# Patient Record
Sex: Female | Born: 2000 | Race: White | Hispanic: No | Smoking: Former smoker
Health system: Southern US, Community
[De-identification: ages and names within clinical notes are randomized; demographics above are authoritative.]

## PROBLEM LIST (undated history)

## (undated) DIAGNOSIS — Z8759 Personal history of other complications of pregnancy, childbirth and the puerperium: Secondary | ICD-10-CM

## (undated) DIAGNOSIS — F909 Attention-deficit hyperactivity disorder, unspecified type: Secondary | ICD-10-CM

## (undated) DIAGNOSIS — F319 Bipolar disorder, unspecified: Secondary | ICD-10-CM

## (undated) DIAGNOSIS — T8859XA Other complications of anesthesia, initial encounter: Secondary | ICD-10-CM

## (undated) DIAGNOSIS — F32A Depression, unspecified: Secondary | ICD-10-CM

## (undated) DIAGNOSIS — Z7289 Other problems related to lifestyle: Secondary | ICD-10-CM

## (undated) DIAGNOSIS — F419 Anxiety disorder, unspecified: Secondary | ICD-10-CM

## (undated) DIAGNOSIS — F329 Major depressive disorder, single episode, unspecified: Secondary | ICD-10-CM

## (undated) DIAGNOSIS — I471 Supraventricular tachycardia, unspecified: Secondary | ICD-10-CM

## (undated) HISTORY — DX: Other complications of anesthesia, initial encounter: T88.59XA

## (undated) HISTORY — DX: Attention-deficit hyperactivity disorder, unspecified type: F90.9

## (undated) HISTORY — DX: Depression, unspecified: F32.A

## (undated) HISTORY — PX: NO PAST SURGERIES: SHX2092

## (undated) HISTORY — DX: Anxiety disorder, unspecified: F41.9

## (undated) HISTORY — DX: Bipolar disorder, unspecified: F31.9

## (undated) HISTORY — DX: Personal history of other complications of pregnancy, childbirth and the puerperium: Z87.59

## (undated) HISTORY — DX: Major depressive disorder, single episode, unspecified: F32.9

---
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
## 2022-04-14 ENCOUNTER — Ambulatory Visit
Admission: EM | Admit: 2022-04-14 | Discharge: 2022-04-14 | Disposition: A | Discharge status: Home / Self Care | Payer: Medicaid Other

## 2022-04-14 DIAGNOSIS — J039 Acute tonsillitis, unspecified: Secondary | ICD-10-CM

## 2022-04-14 LAB — POCT RAPID STREP A (OFFICE): Rapid Strep A Screen: NEGATIVE

## 2022-04-14 MED ORDER — FLUCONAZOLE 150 MG PO TABS: 150.0000 mg | ORAL_TABLET | ORAL | 0 refills | Status: DC

## 2022-04-14 MED ORDER — AMOXICILLIN 875 MG PO TABS: 875.0000 mg | ORAL_TABLET | Freq: Two times a day (BID) | ORAL | 0 refills | Status: DC

## 2022-04-14 NOTE — ED Triage Notes (Signed)
Here for "congestion, sore throat" that started yesterday. "Lymph nodes feel swollen". Also of "green mucous, discharge down throat, chills, no fever. No body aches". Have not had flu vaccine this year. No "runny" nose.

## 2022-04-14 NOTE — ED Provider Notes (Signed)
[facility]    CSN: [id number] Arrival date & time: 04/14/22  1229      History   Chief Complaint Chief Complaint  Patient presents with   Nasal Congestion    HPI Amy Barrera is a 22 y.o. female.   Patient presenting today with 1 day history of sore swollen feeling throat, swollen lymph nodes in neck, thick green discharge in throat, chills.  Denies fever, chest pain, shortness of breath, abdominal pain, nausea vomiting or diarrhea.  So far trying over-the-counter cold and congestion medications with minimal relief.  History of recurrent strep infections.    Past Medical History:  Diagnosis Date   ADHD    Anxiety    Bipolar disorder ([place])    Depression    Irritability and anger 11/30/2015   Self-mutilation     Patient Active Problem List   Diagnosis Date Noted   Erroneous encounter - disregard 12/06/2021   Palpitations 11/16/2021   Dizziness 11/16/2021   Chest tightness 11/16/2021   Postpartum hemorrhage 07/27/2021   Indication for care in labor or delivery 07/26/2021   Alpha thalassemia silent carrier 03/31/2021   Bony abnormality 10/06/2014   Primary familial hypertrophic cardiomyopathy ([place]) 10/05/2013    Past Surgical History:  Procedure Laterality Date   NO PAST SURGERIES      OB History     Gravida  1   Para  1   Term  1   Preterm  0   AB  0   Living  1      SAB  0   IAB  0   Ectopic  0   Multiple  0   Live Births  1            Home Medications    Prior to Admission medications   Medication Sig Start Date End Date Taking? Authorizing Provider  acetaminophen (TYLENOL) 325 MG tablet Take by mouth. 04/21/20  Yes [provider]  amoxicillin (AMOXIL) 875 MG tablet Take 1 tablet (875 mg total) by mouth 2 (two) times daily. 04/14/22  Yes Volney American, PA-C  amoxicillin-clavulanate (AUGMENTIN) 875-125 MG tablet Take 1 tablet by mouth every 12 (twelve) hours. 12/13/21  Yes Volney American,  PA-C  busPIRone (BUSPAR) 10 MG tablet Take by mouth. 01/25/20  Yes [provider]  cariprazine (VRAYLAR) 3 MG capsule Take by mouth. 09/17/19  Yes [provider]  divalproex (DEPAKOTE SPRINKLE) 125 MG capsule Take by mouth. 01/25/20  Yes [provider]  fluconazole (DIFLUCAN) 150 MG tablet Take 1 tablet (150 mg total) by mouth every other day. 04/14/22  Yes Volney American, PA-C  fluticasone [facility]) 50 MCG/ACT nasal spray Place 1 spray into both nostrils 2 (two) times daily. 12/13/21  Yes Volney American, PA-C  gabapentin (NEURONTIN) 100 MG capsule Take by mouth. 01/25/20  Yes [provider]  hydrOXYzine (ATARAX) 10 MG tablet Take by mouth. 11/18/19  Yes [provider]  megestrol (MEGACE) 40 MG tablet Take by mouth. 12/21/21  Yes [provider]  metoprolol tartrate (LOPRESSOR) 25 MG tablet Take 0.5 tablets (12.5 mg total) by mouth 2 (two) times daily. 12/11/21 12/06/22 Yes Mallipeddi, Vishnu P, MD  mirtazapine (REMERON) 15 MG tablet Take by mouth. 01/25/20  Yes [provider]  Norgestimate-Ethinyl Estradiol Triphasic 0.18/0.215/0.25 MG-35 MCG tablet Take 1 tablet by mouth daily. 11/09/19  Yes [provider]  QUEtiapine (SEROQUEL) 100 MG tablet Take by mouth. 01/25/20  Yes [provider]  Family History Family History  Adopted: Yes  Problem Relation Age of Onset   Drug abuse Mother    Depression Mother    Bipolar disorder Mother    Drug abuse Father    Alcohol abuse Father    Early death Father        overdose @ 5   Heart disease Maternal Grandmother    Arthritis Paternal Grandmother    Depression Paternal Grandmother     Social History Social History   Tobacco Use   Smoking status: Former    Packs/day: .5    Types: Cigarettes    Passive exposure: Never   Smokeless tobacco: Never  Vaping Use   Vaping Use: Never used  Substance Use Topics   Alcohol use: Not Currently    Comment:  occ   Drug use: Not Currently    Types: Marijuana, Oxycodone    Comment: "tramadol, lyrica, oxy from 2 dealers sometimes"     Allergies   Patient has no known allergies.   Review of Systems Review of Systems Per HPI  Physical Exam Triage Vital Signs ED Triage Vitals  Enc Vitals Group     BP 04/14/22 1259 (!) 140/83     Pulse Rate 04/14/22 1259 (!) 112     Resp 04/14/22 1259 18     Temp 04/14/22 1259 98.7 F (37.1 C)     Temp Source 04/14/22 1259 Oral     SpO2 04/14/22 1259 97 %     Weight 04/14/22 1254 178 lb (80.7 kg)     Height 04/14/22 1254 5\' 8"  (1.727 m)     Head Circumference --      Peak Flow --      Pain Score 04/14/22 1254 5     Pain Loc --      Pain Edu? --      Excl. in [place]? --    No data found.  Updated Vital Signs BP 138/64 (BP Location: Right Arm)   Pulse (!) 115   Temp 98.7 F (37.1 C) (Oral)   Resp 18   Ht 5\' 8"  (1.727 m)   Wt 178 lb (80.7 kg)   LMP 04/11/2022 (Exact Date)   SpO2 97%   BMI 27.06 kg/m   Visual Acuity Right Eye Distance:   Left Eye Distance:   Bilateral Distance:    Right Eye Near:   Left Eye Near:    Bilateral Near:     Physical Exam Vitals and nursing note reviewed.  Constitutional:      Appearance: Normal appearance.  HENT:     Head: Atraumatic.     Right Ear: Tympanic membrane and external ear normal.     Left Ear: Tympanic membrane and external ear normal.     Nose: Congestion present.     Mouth/Throat:     Mouth: Mucous membranes are moist.     Pharynx: Posterior oropharyngeal erythema present. No oropharyngeal exudate.     Comments: Moderate to significant bilateral tonsillar erythema, edema.  Uvula midline, oral airway patent, no exudates Eyes:     Extraocular Movements: Extraocular movements intact.     Conjunctiva/sclera: Conjunctivae normal.  Cardiovascular:     Rate and Rhythm: Normal rate and regular rhythm.     Heart sounds: Normal heart sounds.  Pulmonary:     Effort: Pulmonary effort is  normal.     Breath sounds: Normal breath sounds. No wheezing.  Musculoskeletal:        General: Normal range of motion.  Cervical back: Normal range of motion and neck supple.  Lymphadenopathy:     Cervical: Cervical adenopathy present.  Skin:    General: Skin is warm and dry.  Neurological:     Mental Status: She is alert and oriented to person, place, and time.  Psychiatric:        Mood and Affect: Mood normal.        Thought Content: Thought content normal.      UC Treatments / Results  Labs (all labs ordered are listed, but only abnormal results are displayed) Labs Reviewed  POCT RAPID STREP A (OFFICE)    EKG   Radiology No results found.  Procedures Procedures (including critical care time)  Medications Ordered in UC Medications - No data to display  Initial Impression / Assessment and Plan / UC Course  I have reviewed the triage vital signs and the nursing notes.  Pertinent labs & imaging results that were available during my care of the patient were reviewed by me and considered in my medical decision making (see chart for details).     Tachycardic in triage, otherwise vital signs reassuring.  Rapid strep was negative, however given symptoms, history and exam findings will cover for a bacterial tonsillitis with amoxicillin, salt water gargles, over-the-counter pain relievers.  Return for worsening symptoms.  She is requested Diflucan as antibiotics tend to give her yeast infections and she just got over 1.  Final Clinical Impressions(s) / UC Diagnoses   Final diagnoses:  Acute tonsillitis, unspecified etiology   Discharge Instructions   None    ED Prescriptions     Medication Sig Dispense Auth. Provider   amoxicillin (AMOXIL) 875 MG tablet Take 1 tablet (875 mg total) by mouth 2 (two) times daily. 20 tablet Volney American, [place]   fluconazole (DIFLUCAN) 150 MG tablet Take 1 tablet (150 mg total) by mouth every other day. 3 tablet Volney American, [place]      PDMP not reviewed this encounter.   Volney American, [place] 04/14/22 [phone]

## 2022-04-26 ENCOUNTER — Telehealth: Payer: Medicaid Other | Admitting: Family Medicine

## 2022-04-26 DIAGNOSIS — T3695XA Adverse effect of unspecified systemic antibiotic, initial encounter: Secondary | ICD-10-CM

## 2022-04-26 DIAGNOSIS — B379 Candidiasis, unspecified: Secondary | ICD-10-CM

## 2022-04-26 MED ORDER — FLUCONAZOLE 150 MG PO TABS: 150.0000 mg | ORAL_TABLET | ORAL | 0 refills | Status: DC

## 2022-04-26 NOTE — Patient Instructions (Signed)
[name], thank you for joining Perlie Mayo, NP for today's virtual visit.  While this provider is not your primary care provider (PCP), if your PCP is located in our provider database this encounter information will be shared with them immediately following your visit.   [facility] account gives you access to today's visit and all your visits, tests, and labs performed at [facility] " click here if you don't have a [facility] account or go to [email or web address]  Consent: (Patient) Amy Barrera provided verbal consent for this virtual visit at the beginning of the encounter.  Current Medications:  Current Outpatient Medications:    fluconazole (DIFLUCAN) 150 MG tablet, Take 1 tablet (150 mg total) by mouth as directed., Disp: 1 tablet, Rfl: 0   acetaminophen (TYLENOL) 325 MG tablet, Take by mouth., Disp: , Rfl:    amoxicillin (AMOXIL) 875 MG tablet, Take 1 tablet (875 mg total) by mouth 2 (two) times daily., Disp: 20 tablet, Rfl: 0   amoxicillin-clavulanate (AUGMENTIN) 875-125 MG tablet, Take 1 tablet by mouth every 12 (twelve) hours., Disp: 14 tablet, Rfl: 0   busPIRone (BUSPAR) 10 MG tablet, Take by mouth., Disp: , Rfl:    cariprazine (VRAYLAR) 3 MG capsule, Take by mouth., Disp: , Rfl:    divalproex (DEPAKOTE SPRINKLE) 125 MG capsule, Take by mouth., Disp: , Rfl:    fluticasone (FLONASE) 50 MCG/ACT nasal spray, Place 1 spray into both nostrils 2 (two) times daily., Disp: 16 g, Rfl: 2   gabapentin (NEURONTIN) 100 MG capsule, Take by mouth., Disp: , Rfl:    hydrOXYzine (ATARAX) 10 MG tablet, Take by mouth., Disp: , Rfl:    megestrol (MEGACE) 40 MG tablet, Take by mouth., Disp: , Rfl:    metoprolol tartrate (LOPRESSOR) 25 MG tablet, Take 0.5 tablets (12.5 mg total) by mouth 2 (two) times daily., Disp: 90 tablet, Rfl: 3   mirtazapine (REMERON) 15 MG tablet, Take by mouth., Disp: , Rfl:    Norgestimate-Ethinyl Estradiol Triphasic  0.18/0.215/0.25 MG-35 MCG tablet, Take 1 tablet by mouth daily., Disp: , Rfl:    QUEtiapine (SEROQUEL) 100 MG tablet, Take by mouth., Disp: , Rfl:    Medications ordered in this encounter:  Meds ordered this encounter  Medications   fluconazole (DIFLUCAN) 150 MG tablet    Sig: Take 1 tablet (150 mg total) by mouth as directed.    Dispense:  1 tablet    Refill:  0    Order Specific Question:   Supervising Provider    Answer:   Chase Picket [id number]     *If you need refills on other medications prior to your next appointment, please contact your pharmacy*  Follow-Up: Call back or seek an in-person evaluation if the symptoms worsen or if the condition fails to improve as anticipated.  [place] [phone]  Other Instructions  Vaginal Yeast Infection, Adult  Vaginal yeast infection is a condition that causes vaginal discharge as well as soreness, swelling, and redness (inflammation) of the vagina. This is a common condition. Some women get this infection frequently. What are the causes? This condition is caused by a change in the normal balance of the yeast (Candida) and normal bacteria that live in the vagina. This change causes an overgrowth of yeast, which causes the inflammation. What increases the risk? The condition is more likely to develop in women who: Take antibiotic medicines. Have diabetes. Take birth control pills. Are pregnant. Douche often. Have  a weak body defense system (immune system). Have been taking steroid medicines for a long time. Frequently wear tight clothing. What are the signs or symptoms? Symptoms of this condition include: White, thick, creamy vaginal discharge. Swelling, itching, redness, and irritation of the vagina. The lips of the vagina (labia) may be affected as well. Pain or a burning feeling while urinating. Pain during sex. How is this diagnosed? This condition is diagnosed based on: Your medical history. A  physical exam. A pelvic exam. Your health care provider will examine a sample of your vaginal discharge under a microscope. Your health care provider may send this sample for testing to confirm the diagnosis. How is this treated? This condition is treated with medicine. Medicines may be over-the-counter or prescription. You may be told to use one or more of the following: Medicine that is taken by mouth (orally). Medicine that is applied as a cream (topically). Medicine that is inserted directly into the vagina (suppository). Follow these instructions at home: Take or apply over-the-counter and prescription medicines only as told by your health care provider. Do not use tampons until your health care provider approves. Do not have sex until your infection has cleared. Sex can prolong or worsen your symptoms of infection. Ask your health care provider when it is safe to resume sexual activity. Keep all follow-up visits. This is important. How is this prevented?  Do not wear tight clothes, such as pantyhose or tight pants. Wear breathable cotton underwear. Do not use douches, perfumed soap, creams, or powders. Wipe from front to back after using the toilet. If you have diabetes, keep your blood sugar levels under control. Ask your health care provider for other ways to prevent yeast infections. Contact a health care provider if: You have a fever. Your symptoms go away and then return. Your symptoms do not get better with treatment. Your symptoms get worse. You have new symptoms. You develop blisters in or around your vagina. You have blood coming from your vagina and it is not your menstrual period. You develop pain in your abdomen. Summary Vaginal yeast infection is a condition that causes discharge as well as soreness, swelling, and redness (inflammation) of the vagina. This condition is treated with medicine. Medicines may be over-the-counter or prescription. Take or apply  over-the-counter and prescription medicines only as told by your health care provider. Do not douche. Resume sexual activity or use of tampons as instructed by your health care provider. Contact a health care provider if your symptoms do not get better with treatment or your symptoms go away and then return. This information is not intended to replace advice given to you by your health care provider. Make sure you discuss any questions you have with your health care provider. Document Revised: 03/28/2020 Document Reviewed: 03/28/2020 Elsevier Patient Education  [place].    If you have been instructed to have an in-person evaluation today at a local Urgent Care facility, please use the link below. It will take you to a list of all of our available [place] [facility], including address, phone number and hours of operation. Please do not delay care.  [facility]  If you or a family member do not have a primary care provider, use the link below to schedule a visit and establish care. When you choose a [facility] primary care physician or advanced practice provider, you gain a long-term partner in health. Find a Primary Care Provider  Learn more about [facility]'s in-office and virtual care options: [place] [facility]

## 2022-04-26 NOTE — Progress Notes (Signed)
Virtual Visit Consent   Amy Barrera, you are scheduled for a virtual visit with a Amy Barrera provider today. Just as with appointments in the office, your consent must be obtained to participate. Your consent will be active for this visit and any virtual visit you may have with one of our providers in the next 365 days. If you have a MyChart account, a copy of this consent can be sent to you electronically.  As this is a virtual visit, video technology does not allow for your provider to perform a traditional examination. This may limit your provider's ability to fully assess your condition. If your provider identifies any concerns that need to be evaluated in person or the need to arrange testing (such as labs, EKG, etc.), we will make arrangements to do so. Although advances in technology are sophisticated, we cannot ensure that it will always work on either your end or our end. If the connection with a video visit is poor, the visit may have to be switched to a telephone visit. With either a video or telephone visit, we are not always able to ensure that we have a secure connection.  By engaging in this virtual visit, you consent to the provision of healthcare and authorize for your insurance to be billed (if applicable) for the services provided during this visit. Depending on your insurance coverage, you may receive a charge related to this service.  I need to obtain your verbal consent now. Are you willing to proceed with your visit today? Amy Barrera has provided verbal consent on 04/26/2022 for a virtual visit (video or telephone). Amy Mayo, NP  Date: 04/26/2022 4:18 PM  Virtual Visit via Video Note   I, Amy Barrera, connected with  Amy Barrera  ([id number], 2000-10-08) on 20/04/24 at  4:30 PM EDT by a video-enabled telemedicine application and verified that I am speaking with the correct person using two identifiers.  Location: Patient: Virtual Visit Location Patient:  Home Provider: Virtual Visit Location Provider: Home Office   I discussed the limitations of evaluation and management by telemedicine and the availability of in person appointments. The patient expressed understanding and agreed to proceed.    History of Present Illness: Amy Barrera is a 22 y.o. who identifies as a female who was assigned female at birth, and is being seen today for yeast infection  Onset was yesterday with itching and discharge Associated symptoms are same as above Modifying factors are none- recent completion of antibiotics for tonsillitis (which has improved)  Denies chest pain, shortness of breath, fevers, chills, UTI, pelvic   Problems:  Patient Active Problem List   Diagnosis Date Noted   Erroneous encounter - disregard 12/06/2021   Palpitations 11/16/2021   Dizziness 11/16/2021   Chest tightness 11/16/2021   Postpartum hemorrhage 07/27/2021   Indication for care in labor or delivery 07/26/2021   Alpha thalassemia silent carrier 03/31/2021   Bony abnormality 10/06/2014   Primary familial hypertrophic cardiomyopathy 10/05/2013    Allergies: No Known Allergies Medications:  Current Outpatient Medications:    acetaminophen (TYLENOL) 325 MG tablet, Take by mouth., Disp: , Rfl:    amoxicillin (AMOXIL) 875 MG tablet, Take 1 tablet (875 mg total) by mouth 2 (two) times daily., Disp: 20 tablet, Rfl: 0   amoxicillin-clavulanate (AUGMENTIN) 875-125 MG tablet, Take 1 tablet by mouth every 12 (twelve) hours., Disp: 14 tablet, Rfl: 0   busPIRone (BUSPAR) 10 MG tablet, Take by mouth., Disp: , Rfl:  cariprazine (VRAYLAR) 3 MG capsule, Take by mouth., Disp: , Rfl:    divalproex (DEPAKOTE SPRINKLE) 125 MG capsule, Take by mouth., Disp: , Rfl:    fluconazole (DIFLUCAN) 150 MG tablet, Take 1 tablet (150 mg total) by mouth every other day., Disp: 3 tablet, Rfl: 0   fluticasone (FLONASE) 50 MCG/ACT nasal spray, Place 1 spray into both nostrils 2 (two) times daily., Disp:  16 g, Rfl: 2   gabapentin (NEURONTIN) 100 MG capsule, Take by mouth., Disp: , Rfl:    hydrOXYzine (ATARAX) 10 MG tablet, Take by mouth., Disp: , Rfl:    megestrol (MEGACE) 40 MG tablet, Take by mouth., Disp: , Rfl:    metoprolol tartrate (LOPRESSOR) 25 MG tablet, Take 0.5 tablets (12.5 mg total) by mouth 2 (two) times daily., Disp: 90 tablet, Rfl: 3   mirtazapine (REMERON) 15 MG tablet, Take by mouth., Disp: , Rfl:    Norgestimate-Ethinyl Estradiol Triphasic 0.18/0.215/0.25 MG-35 MCG tablet, Take 1 tablet by mouth daily., Disp: , Rfl:    QUEtiapine (SEROQUEL) 100 MG tablet, Take by mouth., Disp: , Rfl:   Observations/Objective: Patient is well-developed, well-nourished in no acute distress.  Resting comfortably  at home.  Head is normocephalic, atraumatic.  No labored breathing.  Speech is clear and coherent with logical content.  Patient is alert and oriented at baseline.    Assessment and Plan:   1. Antibiotic-induced yeast infection  - fluconazole (DIFLUCAN) 150 MG tablet; Take 1 tablet (150 mg total) by mouth as directed.  Dispense: 1 tablet; Refill: 0  -classic presentation of antibiotic induced yeast infection -ordered Diflucan  -follow up if not improved   Patient acknowledged agreement and understanding of the plan.    Reviewed side effects, risks and benefits of medication.    Past Medical, Surgical, Social History, Allergies, and Medications have been Reviewed.   Follow Up Instructions: I discussed the assessment and treatment plan with the patient. The patient was provided an opportunity to ask questions and all were answered. The patient agreed with the plan and demonstrated an understanding of the instructions.  A copy of instructions were sent to the patient via MyChart unless otherwise noted below.    The patient was advised to call back or seek an in-person evaluation if the symptoms worsen or if the condition fails to improve as anticipated.  Time:  I  spent 7 minutes with the patient via telehealth technology discussing the above problems/concerns.    Amy Mayo, NP

## 2022-05-09 ENCOUNTER — Encounter: Payer: Self-pay | Admitting: Nurse Practitioner

## 2022-05-09 ENCOUNTER — Telehealth: Payer: Medicaid Other | Admitting: Nurse Practitioner

## 2022-05-09 NOTE — Progress Notes (Signed)
No show for Video Visit, called home and Mychart message sent

## 2022-07-30 ENCOUNTER — Telehealth: Payer: MEDICAID | Admitting: Family Medicine

## 2022-07-30 DIAGNOSIS — J0100 Acute maxillary sinusitis, unspecified: Secondary | ICD-10-CM

## 2022-07-30 DIAGNOSIS — R110 Nausea: Secondary | ICD-10-CM

## 2022-07-30 MED ORDER — ONDANSETRON 4 MG PO TBDP
4.0000 mg | ORAL_TABLET | Freq: Three times a day (TID) | ORAL | 0 refills | Status: DC | PRN
Start: 2022-07-30 — End: 2022-11-27

## 2022-07-30 MED ORDER — FLUTICASONE PROPIONATE 50 MCG/ACT NA SUSP
2.0000 | Freq: Every day | NASAL | 0 refills | Status: DC
Start: 2022-07-30 — End: 2022-11-18

## 2022-07-30 MED ORDER — AMOXICILLIN-POT CLAVULANATE 875-125 MG PO TABS
1.00 | ORAL_TABLET | Freq: Two times a day (BID) | ORAL | 0 refills | Status: AC
Start: 2022-08-02 — End: 2022-08-09

## 2022-07-30 NOTE — Progress Notes (Signed)
Virtual Visit Consent   KERIANA VANOVER, you are scheduled for a virtual visit with a [facility] provider today. Just as with appointments in the office, your consent must be obtained to participate. Your consent will be active for this visit and any virtual visit you may have with one of our providers in the next 365 days. If you have a MyChart account, a copy of this consent can be sent to you electronically.  As this is a virtual visit, video technology does not allow for your provider to perform a traditional examination. This may limit your provider's ability to fully assess your condition. If your provider identifies any concerns that need to be evaluated in person or the need to arrange testing (such as labs, EKG, etc.), we will make arrangements to do so. Although advances in technology are sophisticated, we cannot ensure that it will always work on either your end or our end. If the connection with a video visit is poor, the visit may have to be switched to a telephone visit. With either a video or telephone visit, we are not always able to ensure that we have a secure connection.  By engaging in this virtual visit, you consent to the provision of healthcare and authorize for your insurance to be billed (if applicable) for the services provided during this visit. Depending on your insurance coverage, you may receive a charge related to this service.  I need to obtain your verbal consent now. Are you willing to proceed with your visit today? MAILYN HORITA has provided verbal consent on 07/30/2022 for a virtual visit (video or telephone). Freddy Finner, NP  Date: 07/30/2022 3:32 PM  Virtual Visit via Video Note   I, Freddy Finner, connected with  KIERA ERMAN  ([id number], 2000/08/22) on 07/30/22 at  3:30 PM EDT by a video-enabled telemedicine application and verified that I am speaking with the correct person using two identifiers.  Location: Patient: Virtual Visit Location Patient:  Home Provider: Virtual Visit Location Provider: Home   I discussed the limitations of evaluation and management by telemedicine and the availability of in person appointments. The patient expressed understanding and agreed to proceed.    History of Present Illness: NINEL HOSKIE is a 22 y.o. who identifies as a female who was assigned female at birth, and is being seen today for sinus congestion and mucus production.   Onset was about 5 days ago- congestion Associated symptoms are congestion, sinus pressure, hot/cold feeling, nausea- mucus related, ear pressure and pain behind ear, headache, sore throat Modifying factors are naproxen, ibuprofen and tylenol, claritin- not working  Denies chest pain, shortness of breath, fevers, chills  Exposure to sick contacts- unknown COVID test: neg  Problems:  Patient Active Problem List   Diagnosis Date Noted   Erroneous encounter - disregard 12/06/2021   Palpitations 11/16/2021   Dizziness 11/16/2021   Chest tightness 11/16/2021   Postpartum hemorrhage 07/27/2021   Indication for care in labor or delivery 07/26/2021   Alpha thalassemia silent carrier 03/31/2021   Bony abnormality 10/06/2014   Primary familial hypertrophic cardiomyopathy (HCC) 10/05/2013    Allergies: No Known Allergies Medications:  Current Outpatient Medications:    acetaminophen (TYLENOL) 325 MG tablet, Take by mouth., Disp: , Rfl:    amoxicillin (AMOXIL) 875 MG tablet, Take 1 tablet (875 mg total) by mouth 2 (two) times daily., Disp: 20 tablet, Rfl: 0   amoxicillin-clavulanate (AUGMENTIN) 875-125 MG tablet, Take 1 tablet by mouth every  12 (twelve) hours., Disp: 14 tablet, Rfl: 0   busPIRone (BUSPAR) 10 MG tablet, Take by mouth., Disp: , Rfl:    cariprazine (VRAYLAR) 3 MG capsule, Take by mouth., Disp: , Rfl:    divalproex (DEPAKOTE SPRINKLE) 125 MG capsule, Take by mouth., Disp: , Rfl:    fluconazole (DIFLUCAN) 150 MG tablet, Take 1 tablet (150 mg total) by mouth as  directed., Disp: 1 tablet, Rfl: 0   fluticasone (FLONASE) 50 MCG/ACT nasal spray, Place 1 spray into both nostrils 2 (two) times daily., Disp: 16 g, Rfl: 2   gabapentin (NEURONTIN) 100 MG capsule, Take by mouth., Disp: , Rfl:    hydrOXYzine (ATARAX) 10 MG tablet, Take by mouth., Disp: , Rfl:    megestrol (MEGACE) 40 MG tablet, Take by mouth., Disp: , Rfl:    metoprolol tartrate (LOPRESSOR) 25 MG tablet, Take 0.5 tablets (12.5 mg total) by mouth 2 (two) times daily., Disp: 90 tablet, Rfl: 3   mirtazapine (REMERON) 15 MG tablet, Take by mouth., Disp: , Rfl:    Norgestimate-Ethinyl Estradiol Triphasic 0.18/0.215/0.25 MG-35 MCG tablet, Take 1 tablet by mouth daily., Disp: , Rfl:    QUEtiapine (SEROQUEL) 100 MG tablet, Take by mouth., Disp: , Rfl:   Observations/Objective: Patient is well-developed, well-nourished in no acute distress.  Resting comfortably  at home.  Head is normocephalic, atraumatic.  No labored breathing.  Speech is clear and coherent with logical content.  Patient is alert and oriented at baseline.    Assessment and Plan:  1. Acute non-recurrent maxillary sinusitis  - fluticasone (FLONASE) 50 MCG/ACT nasal spray; Place 2 sprays into both nostrils daily.  Dispense: 16 g; Refill: 0 - amoxicillin-clavulanate (AUGMENTIN) 875-125 MG tablet; Take 1 tablet by mouth 2 (two) times daily for 7 days.  Dispense: 14 tablet; Refill: 0  2. Nausea  - ondansetron (ZOFRAN-ODT) 4 MG disintegrating tablet; Take 1 tablet (4 mg total) by mouth every 8 (eight) hours as needed for nausea or vomiting.  Dispense: 10 tablet; Refill: 0  -delayed Rx- will start Flonase now -zofran for drainage inducing nausea -follow up in person if not improving, if continues to get infections- regular use of flonase and or allergy medication is recommended  Reviewed side effects, risks and benefits of medication.    Patient acknowledged agreement and understanding of the plan.   Past Medical, Surgical,  Social History, Allergies, and Medications have been Reviewed.     Follow Up Instructions: I discussed the assessment and treatment plan with the patient. The patient was provided an opportunity to ask questions and all were answered. The patient agreed with the plan and demonstrated an understanding of the instructions.  A copy of instructions were sent to the patient via MyChart unless otherwise noted below.     The patient was advised to call back or seek an in-person evaluation if the symptoms worsen or if the condition fails to improve as anticipated.  Time:  I spent 10 minutes with the patient via telehealth technology discussing the above problems/concerns.    Freddy Finner, NP

## 2022-07-30 NOTE — Patient Instructions (Signed)
[name], thank you for joining Freddy Finner, NP for today's virtual visit.  While this provider is not your primary care provider (PCP), if your PCP is located in our provider database this encounter information will be shared with them immediately following your visit.   A [facility] MyChart account gives you access to today's visit and all your visits, tests, and labs performed at [facility] " click here if you don't have a [facility] MyChart account or go to [email or web address]  Consent: (Patient) Amy Barrera provided verbal consent for this virtual visit at the beginning of the encounter.  Current Medications:  Current Outpatient Medications:    acetaminophen (TYLENOL) 325 MG tablet, Take by mouth., Disp: , Rfl:    [START ON 08/02/2022] amoxicillin-clavulanate (AUGMENTIN) 875-125 MG tablet, Take 1 tablet by mouth 2 (two) times daily for 7 days., Disp: 14 tablet, Rfl: 0   busPIRone (BUSPAR) 10 MG tablet, Take by mouth., Disp: , Rfl:    cariprazine (VRAYLAR) 3 MG capsule, Take by mouth., Disp: , Rfl:    divalproex (DEPAKOTE SPRINKLE) 125 MG capsule, Take by mouth., Disp: , Rfl:    fluconazole (DIFLUCAN) 150 MG tablet, Take 1 tablet (150 mg total) by mouth as directed., Disp: 1 tablet, Rfl: 0   fluticasone (FLONASE) 50 MCG/ACT nasal spray, Place 2 sprays into both nostrils daily., Disp: 16 g, Rfl: 0   gabapentin (NEURONTIN) 100 MG capsule, Take by mouth., Disp: , Rfl:    hydrOXYzine (ATARAX) 10 MG tablet, Take by mouth., Disp: , Rfl:    megestrol (MEGACE) 40 MG tablet, Take by mouth., Disp: , Rfl:    metoprolol tartrate (LOPRESSOR) 25 MG tablet, Take 0.5 tablets (12.5 mg total) by mouth 2 (two) times daily., Disp: 90 tablet, Rfl: 3   mirtazapine (REMERON) 15 MG tablet, Take by mouth., Disp: , Rfl:    Norgestimate-Ethinyl Estradiol Triphasic 0.18/0.215/0.25 MG-35 MCG tablet, Take 1 tablet by mouth daily., Disp: , Rfl:    ondansetron (ZOFRAN-ODT) 4 MG  disintegrating tablet, Take 1 tablet (4 mg total) by mouth every 8 (eight) hours as needed for nausea or vomiting., Disp: 10 tablet, Rfl: 0   QUEtiapine (SEROQUEL) 100 MG tablet, Take by mouth., Disp: , Rfl:    Medications ordered in this encounter:  Meds ordered this encounter  Medications   fluticasone (FLONASE) 50 MCG/ACT nasal spray    Sig: Place 2 sprays into both nostrils daily.    Dispense:  16 g    Refill:  0    Order Specific Question:   Supervising Provider    Answer:   Merrilee Jansky [id number]   amoxicillin-clavulanate (AUGMENTIN) 875-125 MG tablet    Sig: Take 1 tablet by mouth 2 (two) times daily for 7 days.    Dispense:  14 tablet    Refill:  0    Order Specific Question:   Supervising Provider    Answer:   LAMPTEY, PHILIP O [[id number]]   ondansetron (ZOFRAN-ODT) 4 MG disintegrating tablet    Sig: Take 1 tablet (4 mg total) by mouth every 8 (eight) hours as needed for nausea or vomiting.    Dispense:  10 tablet    Refill:  0    Order Specific Question:   Supervising Provider    Answer:   Merrilee Jansky [id number]     *If you need refills on other medications prior to your next appointment, please contact your pharmacy*  Follow-Up: Call back or seek an in-person evaluation  if the symptoms worsen or if the condition fails to improve as anticipated.  [facility] [phone]  Other Instructions Sinus Infection, Adult A sinus infection is soreness and swelling (inflammation) of your sinuses. Sinuses are hollow spaces in the bones around your face. They are located: Around your eyes. In the middle of your forehead. Behind your nose. In your cheekbones. Your sinuses and nasal passages are lined with a fluid called mucus. Mucus drains out of your sinuses. Swelling can trap mucus in your sinuses. This lets germs (bacteria, virus, or fungus) grow, which leads to infection. Most of the time, this condition is caused by a virus. What are the  causes? Allergies. Asthma. Germs. Things that block your nose or sinuses. Growths in the nose (nasal polyps). Chemicals or irritants in the air. A fungus. This is rare. What increases the risk? Having a weak body defense system (immune system). Doing a lot of swimming or diving. Using nasal sprays too much. Smoking. What are the signs or symptoms? The main symptoms of this condition are pain and a feeling of pressure around the sinuses. Other symptoms include: Stuffy nose (congestion). This may make it hard to breathe through your nose. Runny nose (drainage). Soreness, swelling, and warmth in the sinuses. A cough that may get worse at night. Being unable to smell and taste. Mucus that collects in the throat or the back of the nose (postnasal drip). This may cause a sore throat or bad breath. Being very tired (fatigued). A fever. How is this diagnosed? Your symptoms. Your medical history. A physical exam. Tests to find out if your condition is short-term (acute) or long-term (chronic). Your doctor may: Check your nose for growths (polyps). Check your sinuses using a tool that has a light on one end (endoscope). Check for allergies or germs. Do imaging tests, such as an MRI or CT scan. How is this treated? Treatment for this condition depends on the cause and whether it is short-term or long-term. If caused by a virus, your symptoms should go away on their own within 10 days. You may be given medicines to relieve symptoms. They include: Medicines that shrink swollen tissue in the nose. A spray that treats swelling of the nostrils. Rinses that help get rid of thick mucus in your nose (nasal saline washes). Medicines that treat allergies (antihistamines). Over-the-counter pain relievers. If caused by bacteria, your doctor may wait to see if you will get better without treatment. You may be given antibiotic medicine if you have: A very bad infection. A weak body defense  system. If caused by growths in the nose, surgery may be needed. Follow these instructions at home: Medicines Take, use, or apply over-the-counter and prescription medicines only as told by your doctor. These may include nasal sprays. If you were prescribed an antibiotic medicine, take it as told by your doctor. Do not stop taking it even if you start to feel better. Hydrate and humidify  Drink enough water to keep your pee (urine) pale yellow. Use a cool mist humidifier to keep the humidity level in your home above 50%. Breathe in steam for 10-15 minutes, 3-4 times a day, or as told by your doctor. You can do this in the bathroom while a hot shower is running. Try not to spend time in cool or dry air. Rest Rest as much as you can. Sleep with your head raised (elevated). Make sure you get enough sleep each night. General instructions  Put a warm,  moist washcloth on your face 3-4 times a day, or as often as told by your doctor. Use nasal saline washes as often as told by your doctor. Wash your hands often with soap and water. If you cannot use soap and water, use hand sanitizer. Do not smoke. Avoid being around people who are smoking (secondhand smoke). Keep all follow-up visits. Contact a doctor if: You have a fever. Your symptoms get worse. Your symptoms do not get better within 10 days. Get help right away if: You have a very bad headache. You cannot stop vomiting. You have very bad pain or swelling around your face or eyes. You have trouble seeing. You feel confused. Your neck is stiff. You have trouble breathing. These symptoms may be an emergency. Get help right away. Call 911. Do not wait to see if the symptoms will go away. Do not drive yourself to the hospital. Summary A sinus infection is swelling of your sinuses. Sinuses are hollow spaces in the bones around your face. This condition is caused by tissues in your nose that become inflamed or swollen. This traps germs.  These can lead to infection. If you were prescribed an antibiotic medicine, take it as told by your doctor. Do not stop taking it even if you start to feel better. Keep all follow-up visits. This information is not intended to replace advice given to you by your health care provider. Make sure you discuss any questions you have with your health care provider. Document Revised: 12/13/2020 Document Reviewed: 12/13/2020 Elsevier Patient Education  2024 [employer]    If you have been instructed to have an in-person evaluation today at a local Urgent Care facility, please use the link below. It will take you to a list of all of our available [facility], including address, phone number and hours of operation. Please do not delay care.  [facility]  If you or a family member do not have a primary care provider, use the link below to schedule a visit and establish care. When you choose a [facility] primary care physician or advanced practice provider, you gain a long-term partner in health. Find a Primary Care Provider  Learn more about [facility] 's in-office and virtual care options: [facility] - Get Care Now

## 2022-08-01 ENCOUNTER — Ambulatory Visit: Payer: Medicaid Other | Admitting: Family Medicine

## 2022-08-29 ENCOUNTER — Telehealth: Payer: MEDICAID | Admitting: Physician Assistant

## 2022-08-29 DIAGNOSIS — B9689 Other specified bacterial agents as the cause of diseases classified elsewhere: Secondary | ICD-10-CM | POA: Diagnosis not present

## 2022-08-29 DIAGNOSIS — N760 Acute vaginitis: Secondary | ICD-10-CM

## 2022-08-29 MED ORDER — CLINDAMYCIN PHOSPHATE 2 % VA CREA
1.0000 | TOPICAL_CREAM | Freq: Every day | VAGINAL | 0 refills | Status: DC
Start: 2022-08-29 — End: 2022-08-30

## 2022-08-29 NOTE — Progress Notes (Signed)
I have spent 5 minutes in review of e-visit questionnaire, review and updating patient chart, medical decision making and response to patient.   Mysha Peeler Cody , PA-C    

## 2022-08-29 NOTE — Progress Notes (Signed)
E-Visit for Vaginal Symptoms  We are sorry that you are not feeling well. Here is how we plan to help! Based on what you shared with me it looks like you: May have a vaginosis due to bacteria  Vaginosis is an inflammation of the vagina that can result in discharge, itching and pain. The cause is usually a change in the normal balance of vaginal bacteria or an infection. Vaginosis can also result from reduced estrogen levels after menopause.  The most common causes of vaginosis are:   Bacterial vaginosis which results from an overgrowth of one on several organisms that are normally present in your vagina.   Yeast infections which are caused by a naturally occurring fungus called candida.   Vaginal atrophy (atrophic vaginosis) which results from the thinning of the vagina from reduced estrogen levels after menopause.   Trichomoniasis which is caused by a parasite and is commonly transmitted by sexual intercourse.  Factors that increase your risk of developing vaginosis include: Medications, such as antibiotics and steroids Uncontrolled diabetes Use of hygiene products such as bubble bath, vaginal spray or vaginal deodorant Douching Wearing damp or tight-fitting clothing Using an intrauterine device (IUD) for birth control Hormonal changes, such as those associated with pregnancy, birth control pills or menopause Sexual activity Having a sexually transmitted infection  Your treatment plan is Clindamycin vaginal cream 5 grams applied vaginally for 7 days.  I have electronically sent this prescription into the pharmacy that you have chosen.  Be sure to take all of the medication as directed. Stop taking any medication if you develop a rash, tongue swelling or shortness of breath. Mothers who are breast feeding should consider pumping and discarding their breast milk while on these antibiotics. However, there is no consensus that infant exposure at these doses would be harmful.  Remember  that medication creams can weaken latex condoms. .   HOME CARE:  Good hygiene may prevent some types of vaginosis from recurring and may relieve some symptoms:  Avoid baths, hot tubs and whirlpool spas. Rinse soap from your outer genital area after a shower, and dry the area well to prevent irritation. Don't use scented or harsh soaps, such as those with deodorant or antibacterial action. Avoid irritants. These include scented tampons and pads. Wipe from front to back after using the toilet. Doing so avoids spreading fecal bacteria to your vagina.  Other things that may help prevent vaginosis include:  Don't douche. Your vagina doesn't require cleansing other than normal bathing. Repetitive douching disrupts the normal organisms that reside in the vagina and can actually increase your risk of vaginal infection. Douching won't clear up a vaginal infection. Use a latex condom. Both female and female latex condoms may help you avoid infections spread by sexual contact. Wear cotton underwear. Also wear pantyhose with a cotton crotch. If you feel comfortable without it, skip wearing underwear to bed. Yeast thrives in moist environments Your symptoms should improve in the next day or two.  GET HELP RIGHT AWAY IF:  You have pain in your lower abdomen ( pelvic area or over your ovaries) You develop nausea or vomiting You develop a fever Your discharge changes or worsens You have persistent pain with intercourse You develop shortness of breath, a rapid pulse, or you faint.  These symptoms could be signs of problems or infections that need to be evaluated by a medical provider now.  MAKE SURE YOU   Understand these instructions. Will watch your condition. Will get help right   away if you are not doing well or get worse.  Thank you for choosing an e-visit.  Your e-visit answers were reviewed by a board certified advanced clinical practitioner to complete your personal care plan. Depending  upon the condition, your plan could have included both over the counter or prescription medications.  Please review your pharmacy choice. Make sure the pharmacy is open so you can pick up prescription now. If there is a problem, you may contact your provider through MyChart messaging and have the prescription routed to another pharmacy.  Your safety is important to us. If you have drug allergies check your prescription carefully.   For the next 24 hours you can use MyChart to ask questions about today's visit, request a non-urgent call back, or ask for a work or school excuse. You will get an email in the next two days asking about your experience. I hope that your e-visit has been valuable and will speed your recovery.  

## 2022-08-30 MED ORDER — METRONIDAZOLE 0.75 % EX GEL
CUTANEOUS | 0 refills | Status: DC
Start: 2022-08-30 — End: 2022-11-27

## 2022-08-30 NOTE — Addendum Note (Signed)
Addended by: Waldon Merl on: 08/30/2022 10:01 AM   Modules accepted: Orders

## 2022-09-05 ENCOUNTER — Ambulatory Visit: Payer: MEDICAID | Attending: Cardiology

## 2022-09-05 VITALS — HR 90 | Ht 68.0 in | Wt 180.0 lb

## 2022-09-05 DIAGNOSIS — R42 Dizziness and giddiness: Secondary | ICD-10-CM | POA: Diagnosis not present

## 2022-09-05 NOTE — Progress Notes (Signed)
Orthostatic BP  Routed to provider for review.

## 2022-09-30 ENCOUNTER — Inpatient Hospital Stay (HOSPITAL_COMMUNITY)
Admission: AD | Admit: 2022-09-30 | Discharge: 2022-09-30 | Disposition: A | Discharge status: Home / Self Care | Payer: MEDICAID | Attending: Obstetrics & Gynecology | Admitting: Obstetrics & Gynecology

## 2022-09-30 ENCOUNTER — Inpatient Hospital Stay (HOSPITAL_COMMUNITY): Payer: MEDICAID

## 2022-09-30 ENCOUNTER — Encounter (HOSPITAL_COMMUNITY): Payer: Self-pay | Admitting: Obstetrics & Gynecology

## 2022-09-30 ENCOUNTER — Other Ambulatory Visit: Payer: Self-pay

## 2022-09-30 DIAGNOSIS — Z87891 Personal history of nicotine dependence: Secondary | ICD-10-CM | POA: Insufficient documentation

## 2022-09-30 DIAGNOSIS — Z3A01 Less than 8 weeks gestation of pregnancy: Secondary | ICD-10-CM | POA: Diagnosis not present

## 2022-09-30 DIAGNOSIS — M5442 Lumbago with sciatica, left side: Secondary | ICD-10-CM | POA: Diagnosis not present

## 2022-09-30 DIAGNOSIS — R109 Unspecified abdominal pain: Secondary | ICD-10-CM

## 2022-09-30 DIAGNOSIS — O26899 Other specified pregnancy related conditions, unspecified trimester: Secondary | ICD-10-CM

## 2022-09-30 DIAGNOSIS — Z3490 Encounter for supervision of normal pregnancy, unspecified, unspecified trimester: Secondary | ICD-10-CM

## 2022-09-30 DIAGNOSIS — O26891 Other specified pregnancy related conditions, first trimester: Secondary | ICD-10-CM | POA: Diagnosis present

## 2022-09-30 DIAGNOSIS — M79605 Pain in left leg: Secondary | ICD-10-CM

## 2022-09-30 DIAGNOSIS — R1032 Left lower quadrant pain: Secondary | ICD-10-CM | POA: Insufficient documentation

## 2022-09-30 DIAGNOSIS — M5432 Sciatica, left side: Secondary | ICD-10-CM

## 2022-09-30 LAB — WET PREP, GENITAL
Clue Cells Wet Prep HPF POC: NONE SEEN
Sperm: NONE SEEN
Trich, Wet Prep: NONE SEEN
WBC, Wet Prep HPF POC: >=10 — AB (ref <10)
Yeast Wet Prep HPF POC: NONE SEEN

## 2022-09-30 LAB — URINALYSIS, ROUTINE W REFLEX MICROSCOPIC
Bilirubin Urine: NEGATIVE
Glucose, UA: NEGATIVE mg/dL
Hgb urine dipstick: NEGATIVE
Ketones, ur: NEGATIVE mg/dL
Leukocytes,Ua: NEGATIVE
Nitrite: NEGATIVE
Protein, ur: NEGATIVE mg/dL
Specific Gravity, Urine: 1.005 (ref 1.005–1.030)
pH: 7 (ref 5.0–8.0)

## 2022-09-30 LAB — CBC
HCT: 37.5 % (ref 36.0–46.0)
Hemoglobin: 11.7 g/dL — ABNORMAL LOW (ref 12.0–15.0)
MCH: 23.7 pg — ABNORMAL LOW (ref 26.0–34.0)
MCHC: 31.2 g/dL (ref 30.0–36.0)
MCV: 75.9 fL — ABNORMAL LOW (ref 80.0–100.0)
Platelets: 380 10*3/uL (ref 150–400)
RBC: 4.94 MIL/uL (ref 3.87–5.11)
RDW: 14.6 % (ref 11.5–15.5)
WBC: 8.7 10*3/uL (ref 4.0–10.5)
nRBC: 0 % (ref 0.0–0.2)

## 2022-09-30 LAB — HIV ANTIBODY (ROUTINE TESTING W REFLEX): HIV Screen 4th Generation wRfx: NONREACTIVE

## 2022-09-30 LAB — HCG, QUANTITATIVE, PREGNANCY: hCG, Beta Chain, Quant, S: 52264 m[IU]/mL — ABNORMAL HIGH (ref <5)

## 2022-09-30 LAB — POCT PREGNANCY, URINE: Preg Test, Ur: POSITIVE — AB

## 2022-09-30 NOTE — MAU Provider Note (Signed)
History     CSN: [id number]  Arrival date and time: 09/30/22 1524   Event Date/Time   First Provider Initiated Contact with Patient 09/30/22 1813      Chief Complaint  Patient presents with   Abdominal Pain   shoulder pain   HPI [name] is a 22 y.o. year old G22P1001 female at [redacted]w[redacted]d weeks gestation who presents to MAU reporting LLQ pain that radiates to her lower back, down her LT thigh and knee for 3 days. She describes the pain as intermittent and cramping rating it 6/10. She took Tylenol around 0830 without relief. She plans to receive [facility]; her first appt is 10/16/2022. Her partner is present and contributing to the history taking.   OB History     Gravida  2   Para  1   Term  1   Preterm  0   AB  0   Living  1      SAB  0   IAB  0   Ectopic  0   Multiple  0   Live Births  1           Past Medical History:  Diagnosis Date   ADHD    Anxiety    Bipolar disorder (HCC)    Depression    Irritability and anger 11/30/2015   Self-mutilation     Past Surgical History:  Procedure Laterality Date   NO PAST SURGERIES      Family History  Adopted: Yes  Problem Relation Age of Onset   Drug abuse Mother    Depression Mother    Bipolar disorder Mother    Drug abuse Father    Alcohol abuse Father    Early death Father        overdose @ 74   Heart disease Maternal Grandmother    Arthritis Paternal Grandmother    Depression Paternal Grandmother     Social History   Tobacco Use   Smoking status: Former    Current packs/day: 0.50    Types: Cigarettes    Passive exposure: Never   Smokeless tobacco: Never  Vaping Use   Vaping status: Never Used  Substance Use Topics   Alcohol use: Not Currently    Comment: occ   Drug use: Not Currently    Types: Marijuana, Oxycodone    Comment: "tramadol, lyrica, oxy from 2 dealers sometimes"    Allergies: No Known Allergies  Medications Prior to Admission  Medication Sig  Dispense Refill Last Dose   acetaminophen (TYLENOL) 325 MG tablet Take by mouth.      busPIRone (BUSPAR) 10 MG tablet Take by mouth.      cariprazine (VRAYLAR) 3 MG capsule Take by mouth.      divalproex (DEPAKOTE SPRINKLE) 125 MG capsule Take by mouth.      fluconazole (DIFLUCAN) 150 MG tablet Take 1 tablet (150 mg total) by mouth as directed. 1 tablet 0    fluticasone (FLONASE) 50 MCG/ACT nasal spray Place 2 sprays into both nostrils daily. 16 g 0    gabapentin (NEURONTIN) 100 MG capsule Take by mouth.      hydrOXYzine (ATARAX) 10 MG tablet Take by mouth.      megestrol (MEGACE) 40 MG tablet Take by mouth.      metoprolol tartrate (LOPRESSOR) 25 MG tablet Take 0.5 tablets (12.5 mg total) by mouth 2 (two) times daily. 90 tablet 3    metroNIDAZOLE (METROGEL) 0.75 % gel Insert  one applicatorful (5g) of medicine into the vagina once nightly x 5 days 25 g 0    mirtazapine (REMERON) 15 MG tablet Take by mouth.      Norgestimate-Ethinyl Estradiol Triphasic 0.18/0.215/0.25 MG-35 MCG tablet Take 1 tablet by mouth daily.      ondansetron (ZOFRAN-ODT) 4 MG disintegrating tablet Take 1 tablet (4 mg total) by mouth every 8 (eight) hours as needed for nausea or vomiting. 10 tablet 0    QUEtiapine (SEROQUEL) 100 MG tablet Take by mouth.       Review of Systems  Constitutional: Negative.   HENT: Negative.    Eyes: Negative.   Respiratory: Negative.    Cardiovascular: Negative.   Gastrointestinal: Negative.   Endocrine: Negative.   Genitourinary:  Positive for pelvic pain.  Musculoskeletal:  Positive for back pain and myalgias.  Skin: Negative.   Allergic/Immunologic: Negative.   Neurological: Negative.   Hematological: Negative.   Psychiatric/Behavioral: Negative.     Physical Exam   Blood pressure 123/66, pulse 88, temperature 97.7 F (36.5 C), temperature source Oral, resp. rate 18, height 5\' 8"  (1.727 m), weight 84.7 kg, last menstrual period 08/21/2022, SpO2 100%, currently  breastfeeding.  Physical Exam Vitals and nursing note reviewed.  Constitutional:      Appearance: Normal appearance. She is normal weight.  Cardiovascular:     Rate and Rhythm: Normal rate.  Pulmonary:     Effort: Pulmonary effort is normal.  Genitourinary:    Comments: Swabs collected by patient using blind swab technique  Musculoskeletal:        General: Normal range of motion.  Skin:    General: Skin is warm and dry.  Neurological:     Mental Status: She is alert and oriented to person, place, and time.  Psychiatric:        Mood and Affect: Mood normal.        Behavior: Behavior normal.        Thought Content: Thought content normal.        Judgment: Judgment normal.    MAU Course  MDM CCUA UPT,  CBC,  ABO/Rh, -- not drawn, known O POS HCG,  OB U/S < 14 wks, OB <14 wks TVUS to R/O ectopic pregnancy  Results for orders placed or performed during the hospital encounter of 09/30/22 (from the past 24 hour(s))  Pregnancy, urine POC     Status: Abnormal   Collection Time: 09/30/22  4:14 PM  Result Value Ref Range   Preg Test, Ur POSITIVE (A) NEGATIVE  Urinalysis, Routine w reflex microscopic -Urine, Clean Catch     Status: Abnormal   Collection Time: 09/30/22  4:23 PM  Result Value Ref Range   Color, Urine STRAW (A) YELLOW   APPearance CLEAR CLEAR   Specific Gravity, Urine 1.005 1.005 - 1.030   pH 7.0 5.0 - 8.0   Glucose, UA NEGATIVE NEGATIVE mg/dL   Hgb urine dipstick NEGATIVE NEGATIVE   Bilirubin Urine NEGATIVE NEGATIVE   Ketones, ur NEGATIVE NEGATIVE mg/dL   Protein, ur NEGATIVE NEGATIVE mg/dL   Nitrite NEGATIVE NEGATIVE   Leukocytes,Ua NEGATIVE NEGATIVE  Wet prep, genital     Status: Abnormal   Collection Time: 09/30/22  5:02 PM   Specimen: PATH Cytology Cervicovaginal Ancillary Only  Result Value Ref Range   Yeast Wet Prep HPF POC NONE SEEN NONE SEEN   Trich, Wet Prep NONE SEEN NONE SEEN   Clue Cells Wet Prep HPF POC NONE SEEN NONE SEEN   WBC, Wet  Prep  HPF POC >=10 (A) <10   Sperm NONE SEEN   CBC     Status: Abnormal   Collection Time: 09/30/22  5:18 PM  Result Value Ref Range   WBC 8.7 4.0 - 10.5 K/uL   RBC 4.94 3.87 - 5.11 MIL/uL   Hemoglobin 11.7 (L) 12.0 - 15.0 g/dL   HCT 13.2 44.0 - 10.2 %   MCV 75.9 (L) 80.0 - 100.0 fL   MCH 23.7 (L) 26.0 - 34.0 pg   MCHC 31.2 30.0 - 36.0 g/dL   RDW 72.5 36.6 - 44.0 %   Platelets 380 150 - 400 K/uL   nRBC 0.0 0.0 - 0.2 %  hCG, quantitative, pregnancy     Status: Abnormal   Collection Time: 09/30/22  5:18 PM  Result Value Ref Range   hCG, Beta Chain, Quant, S 52,264 (H) <5 mIU/mL  HIV Antibody (routine testing w rflx)     Status: None   Collection Time: 09/30/22  5:18 PM  Result Value Ref Range   HIV Screen 4th Generation wRfx Non Reactive Non Reactive    US OB LESS THAN 14 WEEKS WITH OB TRANSVAGINAL  Result Date: 09/30/2022 CLINICAL DATA:  Abdominal pain during pregnancy. EXAM: OBSTETRIC <14 WK [place] AND TRANSVAGINAL OB US TECHNIQUE: Both transabdominal and transvaginal ultrasound examinations were performed for complete evaluation of the gestation as well as the maternal uterus, adnexal regions, and pelvic cul-de-sac. Transvaginal technique was performed to assess early pregnancy. COMPARISON:  Obstetric ultrasound June 26, 2021 FINDINGS: Intrauterine gestational sac: Single Yolk sac:  Visualized. Embryo:  Visualized. Cardiac Activity: Visualized. Heart Rate: 119 bpm CRL:  4.7 mm   6 w   1 d                  [place] EDC: 05/25/2023 Subchorionic hemorrhage:  None visualized. Maternal uterus/adnexae: Normal. IMPRESSION: Single live intrauterine pregnancy corresponding to 6 weeks and 1 day gestation. Electronically Signed   By: Ted Mcalpine M.D.   On: 09/30/2022 18:20      Assessment and Plan  1. Intrauterine pregnancy - Start PNC 2. Abdominal pain affecting pregnancy - Information provided on abdominal pain in pregnancy - No source identified on U/S   3. Musculoskeletal pain of lower  extremity, left Comments: deltoid area - Massage Tiger Balm into muscles, soak in warm water, hydrate, rest and relax  4. Sciatic pain, left - Information provided on sciatic nerve pain   5. [redacted] weeks gestation of pregnancy  - Discharge patient - Keep scheduled appts with [facility] - Patient verbalized an understanding of the plan of care and agrees.   Raelyn Mora, CNM 09/30/2022, 6:14 PM

## 2022-09-30 NOTE — MAU Note (Signed)
Amy Barrera is a 22 y.o. at Unknown here in MAU reporting: she has LLQ pain that radiates into the back and down into the left thigh and knee.  Reports pain is intermittent and cramping .  Reports took Tylenol today between 0800 and 0900 this morning, no relief noted.  Denies VB. LMP: 08/21/2022 Onset of complaint: 3 days ago Pain score: 6 Vitals:   09/30/22 1620  BP: 123/66  Pulse: 88  Resp: 18  Temp: 97.7 F (36.5 C)  SpO2: 100%     FHT:NA Lab orders placed from triage:   UPT & UA

## 2022-10-01 LAB — GC/CHLAMYDIA PROBE AMP (~~LOC~~) NOT AT ARMC
Chlamydia: NEGATIVE
Comment: NEGATIVE
Comment: NORMAL
Neisseria Gonorrhea: NEGATIVE

## 2022-10-16 DIAGNOSIS — Z8759 Personal history of other complications of pregnancy, childbirth and the puerperium: Secondary | ICD-10-CM | POA: Insufficient documentation

## 2022-10-16 LAB — OB RESULTS CONSOLE HIV ANTIBODY (ROUTINE TESTING): HIV: NONREACTIVE

## 2022-10-16 LAB — HEPATITIS C ANTIBODY: HCV Ab: NEGATIVE

## 2022-10-16 LAB — OB RESULTS CONSOLE RPR: RPR: NONREACTIVE

## 2022-10-16 LAB — OB RESULTS CONSOLE ABO/RH: RH Type: POSITIVE

## 2022-10-16 LAB — OB RESULTS CONSOLE ANTIBODY SCREEN: Antibody Screen: NEGATIVE

## 2022-10-16 LAB — OB RESULTS CONSOLE HEPATITIS B SURFACE ANTIGEN: Hepatitis B Surface Ag: NEGATIVE

## 2022-10-16 LAB — OB RESULTS CONSOLE RUBELLA ANTIBODY, IGM: Rubella: IMMUNE

## 2022-11-02 ENCOUNTER — Other Ambulatory Visit: Payer: Self-pay

## 2022-11-02 ENCOUNTER — Inpatient Hospital Stay (HOSPITAL_COMMUNITY)
Admission: AD | Admit: 2022-11-02 | Discharge: 2022-11-02 | Disposition: A | Discharge status: Home / Self Care | Payer: MEDICAID | Attending: Obstetrics & Gynecology | Admitting: Obstetrics & Gynecology

## 2022-11-02 DIAGNOSIS — R109 Unspecified abdominal pain: Secondary | ICD-10-CM | POA: Diagnosis not present

## 2022-11-02 DIAGNOSIS — Z3A10 10 weeks gestation of pregnancy: Secondary | ICD-10-CM | POA: Diagnosis not present

## 2022-11-02 DIAGNOSIS — O26891 Other specified pregnancy related conditions, first trimester: Secondary | ICD-10-CM | POA: Insufficient documentation

## 2022-11-02 DIAGNOSIS — R102 Pelvic and perineal pain: Secondary | ICD-10-CM | POA: Insufficient documentation

## 2022-11-02 DIAGNOSIS — Z3401 Encounter for supervision of normal first pregnancy, first trimester: Secondary | ICD-10-CM

## 2022-11-02 DIAGNOSIS — O26899 Other specified pregnancy related conditions, unspecified trimester: Secondary | ICD-10-CM

## 2022-11-02 LAB — URINALYSIS, ROUTINE W REFLEX MICROSCOPIC
Bilirubin Urine: NEGATIVE
Glucose, UA: NEGATIVE mg/dL
Hgb urine dipstick: NEGATIVE
Ketones, ur: NEGATIVE mg/dL
Leukocytes,Ua: NEGATIVE
Nitrite: NEGATIVE
Protein, ur: NEGATIVE mg/dL
Specific Gravity, Urine: 1.01 (ref 1.005–1.030)
pH: 6 (ref 5.0–8.0)

## 2022-11-02 LAB — WET PREP, GENITAL
Clue Cells Wet Prep HPF POC: NONE SEEN
Sperm: NONE SEEN
Trich, Wet Prep: NONE SEEN
WBC, Wet Prep HPF POC: >=10 — AB (ref <10)
Yeast Wet Prep HPF POC: NONE SEEN

## 2022-11-02 NOTE — Progress Notes (Signed)
Bedside US done by Dr. Leanora Cover, IUP noted and moving around.  FHR 169 bpm

## 2022-11-02 NOTE — MAU Note (Signed)
Amy Barrera is a 22 y.o. at [redacted]w[redacted]d here in MAU reporting: she's had cramping for the past 3 days.  Reports cramping has been consistent and is affecting ability to sleep.  Cramping pain isn't resolved with Tylenol, last taken @ 1000 this morning.  Also states has had 2 gushes of fluid today, fluid is clear and has a sweet odor.  Also reports has passed a glob of mucus streaked with red resembling her mucus plug and has intermittent pain in pelvis region with urination.  States she also has a feeling of impending dread and doom, her intuition tells her something is wrong, states can't shake the feeling. LMP: NA Onset of complaint: 3 days ago Pain score: 6 Vitals:   11/02/22 1528  BP: (!) 142/69  Pulse: (!) 101  Resp: 18  Temp: 98.1 F (36.7 C)  SpO2: 100%     EXB:[id number], no FHT auscultated Lab orders placed from triage:   UA

## 2022-11-02 NOTE — MAU Provider Note (Signed)
Chief Complaint: Cramping    SUBJECTIVE HPI: Amy Barrera is a 22 y.o. G2P1001 at [redacted]w[redacted]d by LMP who presents to maternity admissions reporting cramping for the past 3 days, consistent and is affecting ability to sleep. Not relieved with Tylenol, last taken @ 1000 this morning. Reports 2 gushes of fluid today, fluid is clear and has a sweet odor. A glob of mucus streaked with red resembling her mucus plug also passed today. Endorses dull cramping intermittent pain in pelvis region with urination. She also has a feeling of impending dread and doom, her intuition tells her something is wrong, states can't shake the feeling.   Feeling greatly improved after bedside US of very active fetus.  She denies vaginal bleeding, vaginal itching, urinary symptoms, h/a, dizziness, n/v, or fever/chills.     HPI  Past Medical History:  Diagnosis Date   ADHD    Anxiety    Bipolar disorder (HCC)    Depression    Irritability and anger 11/30/2015   Self-mutilation    Past Surgical History:  Procedure Laterality Date   NO PAST SURGERIES     Social History   Socioeconomic History   Marital status: Significant Other    Spouse name: Not on file   Number of children: Not on file   Years of education: Not on file   Highest education level: High school graduate  Occupational History   Occupation: unemployed  Tobacco Use   Smoking status: Former    Current packs/day: 0.50    Types: Cigarettes    Passive exposure: Never   Smokeless tobacco: Never  Vaping Use   Vaping status: Never Used  Substance and Sexual Activity   Alcohol use: Not Currently    Comment: occ   Drug use: Not Currently    Types: Marijuana, Oxycodone    Comment: "tramadol, lyrica, oxy from 2 dealers sometimes"   Sexual activity: Not Currently  Other Topics Concern   Not on file  Social History Narrative   Lives with grandmother       Plans to home school for 9th grade, will repeat 9th grade fall 2019    Social Determinants  of Health   Financial Resource Strain: Low Risk  (04/27/2021)   Overall Financial Resource Strain (CARDIA)    Difficulty of Paying Living Expenses: Not very hard  Food Insecurity: No Food Insecurity (04/27/2021)   Hunger Vital Sign    Worried About Running Out of Food in the Last Year: Never true    Ran Out of Food in the Last Year: Never true  Transportation Needs: No Transportation Needs (04/27/2021)   PRAPARE - Administrator, Civil Service (Medical): No    Lack of Transportation (Non-Medical): No  Physical Activity: Insufficiently Active (04/27/2021)   Exercise Vital Sign    Days of Exercise per Week: 2 days    Minutes of Exercise per Session: 20 min  Stress: No Stress Concern Present (04/27/2021)   [employer] of Occupational Health - Occupational Stress Questionnaire    Feeling of Stress : Not at all  Social Connections: Unknown (06/02/2021)   Received from [facility]   Social Network    Social Network: Not on file  Recent Concern: Social Connections - Socially Isolated (04/27/2021)   Social Connection and Isolation Panel [NHANES]    Frequency of Communication with Friends and Family: Twice a week    Frequency of Social Gatherings with Friends and Family: Once a week    Attends  Religious Services: Never    Active Member of Clubs or Organizations: No    Attends Banker Meetings: Never    Marital Status: Never married  Intimate Partner Violence: Not At Risk (10/16/2022)   Received from [facility]   Humiliation, Afraid, Rape, and Kick questionnaire    Fear of Current or Ex-Partner: No    Emotionally Abused: No    Physically Abused: No    Sexually Abused: No   No current facility-administered medications on file prior to encounter.   Current Outpatient Medications on File Prior to Encounter  Medication Sig Dispense Refill   acetaminophen (TYLENOL) 325 MG tablet Take by mouth.     busPIRone (BUSPAR) 10 MG tablet Take by mouth.      cariprazine (VRAYLAR) 3 MG capsule Take by mouth.     divalproex (DEPAKOTE SPRINKLE) 125 MG capsule Take by mouth.     fluticasone (FLONASE) 50 MCG/ACT nasal spray Place 2 sprays into both nostrils daily. 16 g 0   gabapentin (NEURONTIN) 100 MG capsule Take by mouth.     hydrOXYzine (ATARAX) 10 MG tablet Take by mouth.     metoprolol tartrate (LOPRESSOR) 25 MG tablet Take 0.5 tablets (12.5 mg total) by mouth 2 (two) times daily. 90 tablet 3   metroNIDAZOLE (METROGEL) 0.75 % gel Insert one applicatorful (5g) of medicine into the vagina once nightly x 5 days 25 g 0   mirtazapine (REMERON) 15 MG tablet Take by mouth.     ondansetron (ZOFRAN-ODT) 4 MG disintegrating tablet Take 1 tablet (4 mg total) by mouth every 8 (eight) hours as needed for nausea or vomiting. 10 tablet 0   QUEtiapine (SEROQUEL) 100 MG tablet Take by mouth.     No Known Allergies  I have reviewed patient's Past Medical Hx, Surgical Hx, Family Hx, Social Hx, medications and allergies.   ROS:  Review of Systems Review of Systems  Other systems negative   Physical Exam  Physical Exam Patient Vitals for the past 24 hrs:  BP Temp Temp src Pulse Resp SpO2 Height Weight  11/02/22 1528 (!) 142/69 98.1 F (36.7 C) Oral (!) 101 18 100 % -- --  11/02/22 1518 -- -- -- -- -- -- 5\' 8"  (1.727 m) 84.7 kg   Constitutional: Well-developed, well-nourished female in no acute distress.  Cardiovascular: normal rate Respiratory: normal effort GI: Abd soft, non-tender. Pos BS x 4 MS: Extremities nontender, no edema, normal ROM Neurologic: Alert and oriented x 4.  GU: Neg CVAT.  FHT 169 by bedside US, very active fetus  LAB RESULTS No results found for this or any previous visit (from the past 24 hour(s)).     IMAGING No results found.  MAU Management/MDM: I have reviewed the triage vital signs and the nursing notes.   Pertinent labs & imaging results that were available during my care of the patient were reviewed by me and  considered in my medical decision making (see chart for details).      I have reviewed her medical records including past results, notes and treatments. Medical, Surgical, and family history were reviewed.  Medications and recent lab tests were reviewed  Treatments in MAU included therapeutic bedside US.   This bleeding/pain can represent a normal pregnancy with bleeding, spontaneous abortion or even an ectopic which can be life-threatening.  The process as listed above helps to determine which of these is present.  ASSESSMENT 1. Encounter for supervision of normal first pregnancy in first trimester  2. Cramping affecting pregnancy, antepartum   3. [redacted] weeks gestation of pregnancy   Suspect cramping may be early fetal movements given how active baby is today on [place]. Though infections are possible, collected urine and vaginal swabs to r/o infection. 9/8 vaginal swabs were negative.   PLAN Discharge home Vaginal self swabs collected, follow up on results. Patient agreeable to MyChart notification.  Urinalysis collected, follow up on results via MyChart.   Follow-up Information     Orbie Hurst, NP Follow up.   Specialty: Nurse Practitioner Why: Continue routine prenatal care Contact information: [address] [place] [address] [phone]                Pt stable at time of discharge. Encouraged to return here if she develops worsening of symptoms, increase in pain, fever, or other concerning symptoms.   Wyn Forster, MD FMOB Fellow, Faculty practice [facility], [facility] for [facility]  11/02/2022  4:01 PM

## 2022-11-05 LAB — GC/CHLAMYDIA PROBE AMP (~~LOC~~) NOT AT ARMC
Chlamydia: NEGATIVE
Comment: NEGATIVE
Comment: NORMAL
Neisseria Gonorrhea: NEGATIVE

## 2022-11-18 ENCOUNTER — Ambulatory Visit
Admission: EM | Admit: 2022-11-18 | Discharge: 2022-11-18 | Disposition: A | Discharge status: Home / Self Care | Payer: MEDICAID

## 2022-11-18 DIAGNOSIS — J069 Acute upper respiratory infection, unspecified: Secondary | ICD-10-CM | POA: Diagnosis not present

## 2022-11-18 DIAGNOSIS — J3489 Other specified disorders of nose and nasal sinuses: Secondary | ICD-10-CM

## 2022-11-18 HISTORY — DX: Supraventricular tachycardia, unspecified: I47.10

## 2022-11-18 LAB — POCT RAPID STREP A (OFFICE): Rapid Strep A Screen: NEGATIVE

## 2022-11-18 MED ORDER — GUAIFENESIN ER 600 MG PO TB12: 600.0000 mg | ORAL_TABLET | Freq: Two times a day (BID) | ORAL | 0 refills | Status: DC | PRN

## 2022-11-18 MED ORDER — CETIRIZINE HCL 10 MG PO TABS: 10.0000 mg | ORAL_TABLET | Freq: Every day | ORAL | 0 refills | Status: DC

## 2022-11-18 MED ORDER — FLUTICASONE PROPIONATE 50 MCG/ACT NA SUSP: 1.0000 | Freq: Every day | NASAL | 1 refills | Status: DC

## 2022-11-18 MED ORDER — CHLORASEPTIC 1.4 % MT LIQD: 1.0000 | OROMUCOSAL | 0 refills | Status: DC | PRN

## 2022-11-18 NOTE — ED Provider Notes (Signed)
[facility]    CSN: [id number] Arrival date & time: 11/18/22  5284      History   Chief Complaint No chief complaint on file.   HPI Amy Barrera is a 22 y.o. female.   Reports 2 days of nasal drainage and feeling like her tonsils were swollen and "touching each other."  Additionally she reported 2 days of burning sensation in her throat with a mild headache.  She does have a history of recurrent strep throat as a kid and she is nervous that that is what she has now.  She is [redacted] weeks pregnant. She has not been taking any medications over the counter.  She does have a child who is not in daycare.  She has not had any other sick contacts at home at this time, but she does report that there was a GI bug and an upper respiratory infection in her house about a month ago.  She denies fever, vomiting, diarrhea.  She is not having trouble breathing.  At baseline she has tachycardia, HR in chart ranging between 88-125.  She does have an OBGYN, cardiologist, and a PCP that she follows with.       Past Medical History:  Diagnosis Date   ADHD    Anxiety    Bipolar disorder (HCC)    Depression    Irritability and anger 11/30/2015   Self-mutilation    SVT (supraventricular tachycardia) [facility])     Patient Active Problem List   Diagnosis Date Noted   Erroneous encounter - disregard 12/06/2021   Palpitations 11/16/2021   Dizziness 11/16/2021   Chest tightness 11/16/2021   Postpartum hemorrhage 07/27/2021   Indication for care in labor or delivery 07/26/2021   Alpha thalassemia silent carrier 03/31/2021   Bony abnormality 10/06/2014   Primary familial hypertrophic cardiomyopathy (HCC) 10/05/2013    Past Surgical History:  Procedure Laterality Date   NO PAST SURGERIES      OB History     Gravida  2   Para  1   Term  1   Preterm  0   AB  0   Living  1      SAB  0   IAB  0   Ectopic  0   Multiple  0   Live Births  1            Home  Medications    Prior to Admission medications   Medication Sig Start Date End Date Taking? Authorizing Provider  cetirizine (ZYRTEC ALLERGY) 10 MG tablet Take 1 tablet (10 mg total) by mouth daily. 11/18/22  Yes Joncarlos Atkison, Ludger Nutting, NP  fluticasone (FLONASE) 50 MCG/ACT nasal spray Place 1 spray into both nostrils daily. 11/18/22  Yes Traivon Morrical, Ludger Nutting, NP  guaiFENesin (MUCINEX) 600 MG 12 hr tablet Take 1 tablet (600 mg total) by mouth 2 (two) times daily as needed. 11/18/22  Yes Granger Chui, Ludger Nutting, NP  phenol (CHLORASEPTIC) 1.4 % LIQD Use as directed 1 spray in the mouth or throat as needed for throat irritation / pain. 11/18/22  Yes Mansa Willers, Ludger Nutting, NP  acetaminophen (TYLENOL) 325 MG tablet Take by mouth. 04/21/20   [provider]  busPIRone (BUSPAR) 10 MG tablet Take by mouth. 01/25/20   [provider]  cariprazine Leafy Kindle) 3 MG capsule Take by mouth. 09/17/19   [provider]  divalproex (DEPAKOTE SPRINKLE) 125 MG capsule Take by mouth. 01/25/20   [provider]  Ferrous Sulfate (IRON PO) Take 1 tablet by  mouth daily.    [provider]  gabapentin (NEURONTIN) 100 MG capsule Take by mouth. 01/25/20   [provider]  hydrOXYzine (ATARAX) 10 MG tablet Take by mouth. 11/18/19   [provider]  metoprolol tartrate (LOPRESSOR) 25 MG tablet Take 0.5 tablets (12.5 mg total) by mouth 2 (two) times daily. 12/11/21 12/06/22  Mallipeddi, Vishnu P, MD  metroNIDAZOLE (METROGEL) 0.75 % gel Insert one applicatorful (5g) of medicine into the vagina once nightly x 5 days 08/30/22   Waldon Merl, PA-C  mirtazapine (REMERON) 15 MG tablet Take by mouth. 01/25/20   [provider]  ondansetron (ZOFRAN-ODT) 4 MG disintegrating tablet Take 1 tablet (4 mg total) by mouth every 8 (eight) hours as needed for nausea or vomiting. 07/30/22   Freddy Finner, NP  QUEtiapine (SEROQUEL) 100 MG tablet Take by mouth. 01/25/20   [provider]    Family  History Family History  Adopted: Yes  Problem Relation Age of Onset   Drug abuse Mother    Depression Mother    Bipolar disorder Mother    Drug abuse Father    Alcohol abuse Father    Early death Father        overdose @ 63   Heart disease Maternal Grandmother    Arthritis Paternal Grandmother    Depression Paternal Grandmother     Social History Social History   Tobacco Use   Smoking status: Former    Current packs/day: 0.50    Types: Cigarettes    Passive exposure: Never   Smokeless tobacco: Never  Vaping Use   Vaping status: Never Used  Substance Use Topics   Alcohol use: Not Currently    Comment: occ   Drug use: Not Currently    Types: Marijuana, Oxycodone    Comment: "tramadol, lyrica, oxy from 2 dealers sometimes"     Allergies   Patient has no known allergies.   Review of Systems Review of Systems   Physical Exam Triage Vital Signs ED Triage Vitals  Encounter Vitals Group     BP 11/18/22 0824 135/85     Systolic BP Percentile --      Diastolic BP Percentile --      Pulse Rate 11/18/22 0824 (!) 125     Resp 11/18/22 0824 20     Temp 11/18/22 0824 98.4 F (36.9 C)     Temp Source 11/18/22 0824 Oral     SpO2 11/18/22 0824 99 %     Weight --      Height --      Head Circumference --      Peak Flow --      Pain Score 11/18/22 0825 5     Pain Loc --      Pain Education --      Exclude from Growth Chart --    No data found.  Updated Vital Signs BP 135/85 (BP Location: Right Arm)   Pulse (!) 125   Temp 98.4 F (36.9 C) (Oral)   Resp 20   LMP 08/21/2022   SpO2 99%   Breastfeeding No   Visual Acuity Right Eye Distance:   Left Eye Distance:   Bilateral Distance:    Right Eye Near:   Left Eye Near:    Bilateral Near:     Physical Exam Constitutional:      Appearance: Normal appearance. She is normal weight.  HENT:     Right Ear: Tympanic membrane normal.  Left Ear: Tympanic membrane normal.     Mouth/Throat:     Mouth: Mucous  membranes are moist.     Pharynx: Posterior oropharyngeal erythema and postnasal drip present.     Tonsils: No tonsillar exudate. 2+ on the right. 2+ on the left.     Comments: Frontal and maxillary sinus tender with palpation  Cardiovascular:     Rate and Rhythm: Tachycardia present.  Pulmonary:     Effort: Pulmonary effort is normal.     Breath sounds: Normal breath sounds.  Skin:    General: Skin is warm and dry.  Neurological:     Mental Status: She is alert.      UC Treatments / Results  Labs (all labs ordered are listed, but only abnormal results are displayed) Labs Reviewed  SARS CORONAVIRUS 2 (TAT 6-24 HRS)  POCT RAPID STREP A (OFFICE)    EKG   Radiology No results found.  Procedures Procedures (including critical care time)  Medications Ordered in UC Medications - No data to display  Initial Impression / Assessment and Plan / UC Course  I have reviewed the triage vital signs and the nursing notes.  Pertinent labs & imaging results that were available during my care of the patient were reviewed by me and considered in my medical decision making (see chart for details).   Suspect viral URI, viral syndrome. Physical exam findings reassuring, vital signs hemodynamically stable. Low suspicion for pneumonia/acute cardiopulmonary abnormality, therefore deferred imaging of the chest. Advised supportive care, offered prescriptions for symptomatic relief.  Recommend continued use of OTC medications as needed, recommendations discussed with patient/caregiver and outlined in AVS.   Strep testing: Negative  COVID Testing: Pending       Final Clinical Impressions(s) / UC Diagnoses   Final diagnoses:  Viral URI  Sinus pain  Sinus drainage     Discharge Instructions      We are sending a COVID test out for you. You were negative for strep throat.  You have a viral illness which will improve on its own with rest, fluids, and medications to help with your  symptoms. You may use over the counter medicines as needed: mucinex, zyrtec, Flonase Two teaspoons of honey in 1 cup of warm water every 4-6 hours may help with throat pains. Humidifier in room at nighttime may help soothe cough (clean well daily).   For chest pain, shortness of breath, inability to keep food or fluids down without vomiting, fever that does not respond to tylenol or motrin, or any other severe symptoms, please go to the ER for further evaluation. Return to urgent care as needed, otherwise follow-up with PCP.   If your COVID swab comes back positive we will call you.        ED Prescriptions     Medication Sig Dispense Auth. Provider   cetirizine (ZYRTEC ALLERGY) 10 MG tablet Take 1 tablet (10 mg total) by mouth daily. 30 tablet Elmer Picker, NP   guaiFENesin (MUCINEX) 600 MG 12 hr tablet Take 1 tablet (600 mg total) by mouth 2 (two) times daily as needed. 60 tablet Pamalee Leyden, [place], NP   phenol (CHLORASEPTIC) 1.4 % LIQD Use as directed 1 spray in the mouth or throat as needed for throat irritation / pain. 236 mL Elmer Picker, NP   fluticasone (FLONASE) 50 MCG/ACT nasal spray Place 1 spray into both nostrils daily. 15.8 mL Elmer Picker, NP      PDMP not reviewed this encounter.   Elmer Picker,  NP 11/18/22 1610

## 2022-11-18 NOTE — ED Triage Notes (Signed)
Pt reports throat pain x 2 days

## 2022-11-18 NOTE — Discharge Instructions (Addendum)
We are sending a COVID test out for you. You were negative for strep throat.  You have a viral illness which will improve on its own with rest, fluids, and medications to help with your symptoms. You may use over the counter medicines as needed: mucinex, zyrtec, Flonase Two teaspoons of honey in 1 cup of warm water every 4-6 hours may help with throat pains. Humidifier in room at nighttime may help soothe cough (clean well daily).   For chest pain, shortness of breath, inability to keep food or fluids down without vomiting, fever that does not respond to tylenol or motrin, or any other severe symptoms, please go to the ER for further evaluation. Return to urgent care as needed, otherwise follow-up with PCP.   If your COVID swab comes back positive we will call you.

## 2022-11-19 ENCOUNTER — Telehealth: Payer: Self-pay

## 2022-11-19 NOTE — Telephone Encounter (Signed)
Amy Barrera, MT, called stating that a COVID test was sent in a Culture tube but needs to be sent in with a Viral VCM to be tested for COVID.

## 2022-11-21 ENCOUNTER — Telehealth: Payer: MEDICAID | Admitting: Family Medicine

## 2022-11-21 NOTE — Progress Notes (Signed)
The patient no-showed for appointment despite this provider sending direct link, reaching out via phone with no response and waiting for at least 10 minutes from appointment time for patient to join. They will be marked as a NS for this appointment/time.    M , NP    

## 2022-11-27 ENCOUNTER — Inpatient Hospital Stay (HOSPITAL_COMMUNITY)
Admission: AD | Admit: 2022-11-27 | Discharge: 2022-11-27 | Disposition: A | Discharge status: Home / Self Care | Payer: MEDICAID | Attending: Obstetrics & Gynecology | Admitting: Obstetrics & Gynecology

## 2022-11-27 ENCOUNTER — Encounter (HOSPITAL_COMMUNITY): Payer: Self-pay | Admitting: Obstetrics & Gynecology

## 2022-11-27 DIAGNOSIS — W5501XA Bitten by cat, initial encounter: Secondary | ICD-10-CM | POA: Diagnosis not present

## 2022-11-27 DIAGNOSIS — O26892 Other specified pregnancy related conditions, second trimester: Secondary | ICD-10-CM

## 2022-11-27 DIAGNOSIS — Z3A14 14 weeks gestation of pregnancy: Secondary | ICD-10-CM | POA: Diagnosis not present

## 2022-11-27 DIAGNOSIS — T148XXA Other injury of unspecified body region, initial encounter: Secondary | ICD-10-CM | POA: Insufficient documentation

## 2022-11-27 DIAGNOSIS — O9A212 Injury, poisoning and certain other consequences of external causes complicating pregnancy, second trimester: Secondary | ICD-10-CM | POA: Insufficient documentation

## 2022-11-27 MED ORDER — AMOXICILLIN-POT CLAVULANATE 875-125 MG PO TABS: 1.0000 | ORAL_TABLET | Freq: Two times a day (BID) | ORAL | 0 refills | Status: DC

## 2022-11-27 NOTE — MAU Provider Note (Signed)
Chief Complaint: Animal Bite   Event Date/Time   First Provider Initiated Contact with Patient 11/27/22 1319      SUBJECTIVE HPI: Amy Barrera is a 22 y.o. G2P1001 at [redacted]w[redacted]d by LMP who presents to maternity admissions reporting cat bite.  Patient bit by her cat around 10 this morning during a bath. Has had him for some time; he is updated on shots. Has washed to wound and dressed it. No fever/chills, drainage, pus, erythema. Denies abdominal pain, VB, LOF, change in discharge, urinary symptoms.  Had TDaP during last pregnancy ~4/23.  HPI  Past Medical History:  Diagnosis Date   ADHD    Anxiety    Bipolar disorder (HCC)    Depression    Irritability and anger 11/30/2015   Self-mutilation    SVT (supraventricular tachycardia) (HCC)    Past Surgical History:  Procedure Laterality Date   NO PAST SURGERIES     Social History   Socioeconomic History   Marital status: Significant Other    Spouse name: Not on file   Number of children: Not on file   Years of education: Not on file   Highest education level: High school graduate  Occupational History   Occupation: unemployed  Tobacco Use   Smoking status: Former    Current packs/day: 0.50    Types: Cigarettes    Passive exposure: Never   Smokeless tobacco: Never  Vaping Use   Vaping status: Never Used  Substance and Sexual Activity   Alcohol use: Not Currently    Comment: occ   Drug use: Not Currently    Types: Marijuana, Oxycodone    Comment: "tramadol, lyrica, oxy from 2 dealers sometimes"   Sexual activity: Not Currently  Other Topics Concern   Not on file  Social History Narrative   Lives with grandmother       Plans to home school for 9th grade, will repeat 9th grade fall 2019    Social Determinants of Health   Financial Resource Strain: Low Risk  (04/27/2021)   Overall Financial Resource Strain (CARDIA)    Difficulty of Paying Living Expenses: Not very hard  Food Insecurity: No Food Insecurity (04/27/2021)    Hunger Vital Sign    Worried About Running Out of Food in the Last Year: Never true    Ran Out of Food in the Last Year: Never true  Transportation Needs: No Transportation Needs (04/27/2021)   PRAPARE - Administrator, Civil Service (Medical): No    Lack of Transportation (Non-Medical): No  Physical Activity: Insufficiently Active (04/27/2021)   Exercise Vital Sign    Days of Exercise per Week: 2 days    Minutes of Exercise per Session: 20 min  Stress: No Stress Concern Present (04/27/2021)   [employer] of Occupational Health - Occupational Stress Questionnaire    Feeling of Stress : Not at all  Social Connections: Unknown (06/02/2021)   [facility] from [facility]   Social Network    Social Network: Not on file  Recent Concern: Social Connections - Socially Isolated (04/27/2021)   Social Connection and Isolation Panel [NHANES]    Frequency of Communication with Friends and Family: Twice a week    Frequency of Social Gatherings with Friends and Family: Once a week    Attends Religious Services: Never    Database administrator or Organizations: No    Attends Banker Meetings: Never    Marital Status: Never married  Intimate Partner Violence:  Not At Risk (11/13/2022)   Received from [facility]   Humiliation, Afraid, Rape, and Kick questionnaire    Fear of Current or Ex-Partner: No    Emotionally Abused: No    Physically Abused: No    Sexually Abused: No   No current facility-administered medications on file prior to encounter.   Current Outpatient Medications on File Prior to Encounter  Medication Sig Dispense Refill   cetirizine (ZYRTEC ALLERGY) 10 MG tablet Take 1 tablet (10 mg total) by mouth daily. 30 tablet 0   phenol (CHLORASEPTIC) 1.4 % LIQD Use as directed 1 spray in the mouth or throat as needed for throat irritation / pain. 236 mL 0   acetaminophen (TYLENOL) 325 MG tablet Take by mouth.     No Known Allergies  ROS:   Pertinent positives/negatives listed above.  I have reviewed patient's Past Medical Hx, Surgical Hx, Family Hx, Social Hx, medications and allergies.   Physical Exam  Patient Vitals for the past 24 hrs:  BP Temp Temp src Pulse Resp SpO2 Height Weight  11/27/22 1330 132/86 -- -- (!) 105 -- -- -- --  11/27/22 1308 (!) 148/86 98.1 F (36.7 C) Oral 100 17 99 % 5\' 8"  (1.727 m) 86.1 kg   Constitutional: Well-developed, well-nourished female in no acute distress.  Cardiovascular: normal rate Respiratory: normal effort GI: Abd soft, non-tender. Pos BS x 4 MS: Extremities nontender, no edema, normal ROM Neurologic: Alert and oriented x 4.  Skin: L forearm with 2 pinpoint puncture wounds. Hemostatic. No streaking/erythema  FHT unable to be found by doppler. Bedside U/S used to visualized FHT. ~150s. IUP  LAB RESULTS No results found for this or any previous visit (from the past 24 hour(s)).    IMAGING No results found.  MAU Management/MDM: Orders Placed This Encounter  Procedures   Discharge patient   Discharge patient    Meds ordered this encounter  Medications   amoxicillin-clavulanate (AUGMENTIN) 875-125 MG tablet    Sig: Take 1 tablet by mouth 2 (two) times daily.    Dispense:  10 tablet    Refill:  0    Patient presents with cat bite. Very small puncture wounds but did break skin. Skin is clean and dry at this time. TDaP shot within last 5 years. Cat up-to-date on rabies vaccines, so do not need prophylaxis at this time. Will treat with augmentin BID for infection prophylaxis.  Appropriate FHT for gestation.  ASSESSMENT 1. Cat bite, initial encounter   2. [redacted] weeks gestation of pregnancy     PLAN Discharge home with strict return precautions. Allergies as of 11/27/2022   No Known Allergies      Medication List     STOP taking these medications    busPIRone 10 MG tablet Commonly known as: BUSPAR   divalproex 125 MG capsule Commonly known as: DEPAKOTE  SPRINKLE   fluticasone 50 MCG/ACT nasal spray Commonly known as: FLONASE   gabapentin 100 MG capsule Commonly known as: NEURONTIN   guaiFENesin 600 MG 12 hr tablet Commonly known as: Mucinex   hydrOXYzine 10 MG tablet Commonly known as: ATARAX   IRON PO   metoprolol tartrate 25 MG tablet Commonly known as: LOPRESSOR   metroNIDAZOLE 0.75 % gel Commonly known as: METROGEL   mirtazapine 15 MG tablet Commonly known as: REMERON   ondansetron 4 MG disintegrating tablet Commonly known as: ZOFRAN-ODT   QUEtiapine 100 MG tablet Commonly known as: SEROQUEL   Vraylar 3 MG capsule Generic drug:  cariprazine       TAKE these medications    acetaminophen 325 MG tablet Commonly known as: TYLENOL Take by mouth.   amoxicillin-clavulanate 875-125 MG tablet Commonly known as: AUGMENTIN Take 1 tablet by mouth 2 (two) times daily.   cetirizine 10 MG tablet Commonly known as: ZyrTEC Allergy Take 1 tablet (10 mg total) by mouth daily.   Chloraseptic 1.4 % Liqd Generic drug: phenol Use as directed 1 spray in the mouth or throat as needed for throat irritation / pain.         Wylene Simmer, MD OB Fellow 11/27/2022  1:39 PM

## 2022-11-27 NOTE — MAU Note (Signed)
Amy Barrera is a 22 y.o. at [redacted]w[redacted]d here in MAU reporting: cat bit her today around 5.  Punctured in 2 places. Areas are red.  Reports arm is sore.  Reports cleansed area with wound cleansing spray.  Covered with bandage and neosporin.  Onset of complaint: 10 a.m. Pain score: 2 Vitals:   11/27/22 1308  BP: (!) 148/86  Pulse: 100  Resp: 17  Temp: 98.1 F (36.7 C)  SpO2: 99%     WUJ:[id number] to hear Lab orders placed from triage:

## 2022-12-05 ENCOUNTER — Encounter: Payer: MEDICAID | Admitting: *Deleted

## 2022-12-05 ENCOUNTER — Ambulatory Visit (INDEPENDENT_AMBULATORY_CARE_PROVIDER_SITE_OTHER): Payer: MEDICAID | Admitting: Advanced Practice Midwife

## 2022-12-05 ENCOUNTER — Encounter: Payer: Self-pay | Admitting: Advanced Practice Midwife

## 2022-12-05 VITALS — BP 127/83 | HR 109 | Wt 192.0 lb

## 2022-12-05 DIAGNOSIS — Z1332 Encounter for screening for maternal depression: Secondary | ICD-10-CM | POA: Diagnosis not present

## 2022-12-05 DIAGNOSIS — Z3490 Encounter for supervision of normal pregnancy, unspecified, unspecified trimester: Secondary | ICD-10-CM | POA: Insufficient documentation

## 2022-12-05 DIAGNOSIS — Z363 Encounter for antenatal screening for malformations: Secondary | ICD-10-CM | POA: Diagnosis not present

## 2022-12-05 DIAGNOSIS — I422 Other hypertrophic cardiomyopathy: Secondary | ICD-10-CM

## 2022-12-05 DIAGNOSIS — Z3A15 15 weeks gestation of pregnancy: Secondary | ICD-10-CM | POA: Diagnosis not present

## 2022-12-05 DIAGNOSIS — Z8759 Personal history of other complications of pregnancy, childbirth and the puerperium: Secondary | ICD-10-CM | POA: Diagnosis not present

## 2022-12-05 DIAGNOSIS — Z3481 Encounter for supervision of other normal pregnancy, first trimester: Secondary | ICD-10-CM

## 2022-12-05 DIAGNOSIS — R000 Tachycardia, unspecified: Secondary | ICD-10-CM | POA: Insufficient documentation

## 2022-12-05 DIAGNOSIS — Z3480 Encounter for supervision of other normal pregnancy, unspecified trimester: Secondary | ICD-10-CM

## 2022-12-05 MED ORDER — ASPIRIN 81 MG PO CHEW: 162.0000 mg | CHEWABLE_TABLET | Freq: Every day | ORAL | 7 refills | Status: DC

## 2022-12-05 NOTE — Patient Instructions (Signed)
Amy Barrera, thank you for choosing our office today! We appreciate the opportunity to meet your healthcare needs. You may receive a short survey by mail, e-mail, or through MyChart. If you are happy with your care we would appreciate if you could take just a few minutes to complete the survey questions. We read all of your comments and take your feedback very seriously. Thank you again for choosing our office.  [facility] ([address] [place], [place] [address]) Entrance C, located off of [address] Free 24/7 valet parking   Nausea & Vomiting Have saltine crackers or pretzels by your bed and eat a few bites before you raise your head out of bed in the morning Eat small frequent meals throughout the day instead of large meals Drink plenty of fluids throughout the day to stay hydrated, just don't drink a lot of fluids with your meals.  This can make your stomach fill up faster making you feel sick Do not brush your teeth right after you eat Products with real ginger are good for nausea, like ginger ale and ginger hard candy Make sure it says made with real ginger! Sucking on sour candy like lemon heads is also good for nausea If your prenatal vitamins make you nauseated, take them at night so you will sleep through the nausea Sea Bands If you feel like you need medicine for the nausea & vomiting please let us know If you are unable to keep any fluids or food down please let us know   Constipation Drink plenty of fluid, preferably water, throughout the day Eat foods high in fiber such as fruits, vegetables, and grains Exercise, such as walking, is a good way to keep your bowels regular Drink warm fluids, especially warm prune juice, or decaf coffee Eat a 1/2 cup of real oatmeal (not instant), 1/2 cup applesauce, and 1/2-1 cup warm prune juice every day If needed, you may take Colace (docusate sodium) stool softener  once or twice a day to help keep the stool soft.  If you still are having problems with constipation, you may take Miralax once daily as needed to help keep your bowels regular.   Home Blood Pressure Monitoring for Patients   Your provider has recommended that you check your blood pressure (BP) at least once a week at home. If you do not have a blood pressure cuff at home, one will be provided for you. Contact your provider if you have not received your monitor within 1 week.   Helpful Tips for Accurate Home Blood Pressure Checks  Don't smoke, exercise, or drink caffeine 30 minutes before checking your BP Use the restroom before checking your BP (a full bladder can raise your pressure) Relax in a comfortable upright chair Feet on the ground Left arm resting comfortably on a flat surface at the level of your heart Legs uncrossed Back supported Sit quietly and don't talk Place the cuff on your bare arm Adjust snuggly, so that only two fingertips can fit between your skin and the top of the cuff Check 2 readings separated by at least one minute Keep a log of your BP readings For a visual, please reference this diagram: http://ccnc.care/bpdiagram  Provider Name: [facility]     Phone: [phone]  Zone 1: ALL CLEAR  Continue to monitor your symptoms:  BP reading is less than 140 (top number) or less than 90 (bottom   number)  No right upper stomach pain No headaches or seeing spots No feeling nauseated or throwing up No swelling in face and hands  Zone 2: CAUTION Call your doctor's office for any of the following:  BP reading is greater than 140 (top number) or greater than 90 (bottom number)  Stomach pain under your ribs in the middle or right side Headaches or seeing spots Feeling nauseated or throwing up Swelling in face and hands  Zone 3: EMERGENCY  Seek immediate medical care if you have any of the following:  BP reading is greater than160 (top number) or greater than  110 (bottom number) Severe headaches not improving with Tylenol Serious difficulty catching your breath Any worsening symptoms from Zone 2    First Trimester of Pregnancy The first trimester of pregnancy is from week 1 until the end of week 12 (months 1 through 3). A week after a sperm fertilizes an egg, the egg will implant on the wall of the uterus. This embryo will begin to develop into a baby. Genes from you and your partner are forming the baby. The female genes determine whether the baby is a boy or a girl. At 6-8 weeks, the eyes and face are formed, and the heartbeat can be seen on ultrasound. At the end of 12 weeks, all the baby's organs are formed.  Now that you are pregnant, you will want to do everything you can to have a healthy baby. Two of the most important things are to get good prenatal care and to follow your health care provider's instructions. Prenatal care is all the medical care you receive before the baby's birth. This care will help prevent, find, and treat any problems during the pregnancy and childbirth. BODY CHANGES Your body goes through many changes during pregnancy. The changes vary from woman to woman.  You may gain or lose a couple of pounds at first. You may feel sick to your stomach (nauseous) and throw up (vomit). If the vomiting is uncontrollable, call your health care provider. You may tire easily. You may develop headaches that can be relieved by medicines approved by your health care provider. You may urinate more often. Painful urination may mean you have a bladder infection. You may develop heartburn as a result of your pregnancy. You may develop constipation because certain hormones are causing the muscles that push waste through your intestines to slow down. You may develop hemorrhoids or swollen, bulging veins (varicose veins). Your breasts may begin to grow larger and become tender. Your nipples may stick out more, and the tissue that surrounds them  (areola) may become darker. Your gums may bleed and may be sensitive to brushing and flossing. Dark spots or blotches (chloasma, mask of pregnancy) may develop on your face. This will likely fade after the baby is born. Your menstrual periods will stop. You may have a loss of appetite. You may develop cravings for certain kinds of food. You may have changes in your emotions from day to day, such as being excited to be pregnant or being concerned that something may go wrong with the pregnancy and baby. You may have more vivid and strange dreams. You may have changes in your hair. These can include thickening of your hair, rapid growth, and changes in texture. Some women also have hair loss during or after pregnancy, or hair that feels dry or thin. Your hair will most likely return to normal after your baby is born. WHAT TO EXPECT AT YOUR PRENATAL   VISITS During a routine prenatal visit: You will be weighed to make sure you and the baby are growing normally. Your blood pressure will be taken. Your abdomen will be measured to track your baby's growth. The fetal heartbeat will be listened to starting around week 10 or 12 of your pregnancy. Test results from any previous visits will be discussed. Your health care provider may ask you: How you are feeling. If you are feeling the baby move. If you have had any abnormal symptoms, such as leaking fluid, bleeding, severe headaches, or abdominal cramping. If you have any questions. Other tests that may be performed during your first trimester include: Blood tests to find your blood type and to check for the presence of any previous infections. They will also be used to check for low iron levels (anemia) and Rh antibodies. Later in the pregnancy, blood tests for diabetes will be done along with other tests if problems develop. Urine tests to check for infections, diabetes, or protein in the urine. An ultrasound to confirm the proper growth and development  of the baby. An amniocentesis to check for possible genetic problems. Fetal screens for spina bifida and Down syndrome. You may need other tests to make sure you and the baby are doing well. HOME CARE INSTRUCTIONS  Medicines Follow your health care provider's instructions regarding medicine use. Specific medicines may be either safe or unsafe to take during pregnancy. Take your prenatal vitamins as directed. If you develop constipation, try taking a stool softener if your health care provider approves. Diet Eat regular, well-balanced meals. Choose a variety of foods, such as meat or vegetable-based protein, fish, milk and low-fat dairy products, vegetables, fruits, and whole grain breads and cereals. Your health care provider will help you determine the amount of weight gain that is right for you. Avoid raw meat and uncooked cheese. These carry germs that can cause birth defects in the baby. Eating four or five small meals rather than three large meals a day may help relieve nausea and vomiting. If you start to feel nauseous, eating a few soda crackers can be helpful. Drinking liquids between meals instead of during meals also seems to help nausea and vomiting. If you develop constipation, eat more high-fiber foods, such as fresh vegetables or fruit and whole grains. Drink enough fluids to keep your urine clear or pale yellow. Activity and Exercise Exercise only as directed by your health care provider. Exercising will help you: Control your weight. Stay in shape. Be prepared for labor and delivery. Experiencing pain or cramping in the lower abdomen or low back is a good sign that you should stop exercising. Check with your health care provider before continuing normal exercises. Try to avoid standing for long periods of time. Move your legs often if you must stand in one place for a long time. Avoid heavy lifting. Wear low-heeled shoes, and practice good posture. You may continue to have sex  unless your health care provider directs you otherwise. Relief of Pain or Discomfort Wear a good support bra for breast tenderness.   Take warm sitz baths to soothe any pain or discomfort caused by hemorrhoids. Use hemorrhoid cream if your health care provider approves.   Rest with your legs elevated if you have leg cramps or low back pain. If you develop varicose veins in your legs, wear support hose. Elevate your feet for 15 minutes, 3-4 times a day. Limit salt in your diet. Prenatal Care Schedule your prenatal visits by the   twelfth week of pregnancy. They are usually scheduled monthly at first, then more often in the last 2 months before delivery. Write down your questions. Take them to your prenatal visits. Keep all your prenatal visits as directed by your health care provider. Safety Wear your seat belt at all times when driving. Make a list of emergency phone numbers, including numbers for family, friends, the hospital, and police and fire departments. General Tips Ask your health care provider for a referral to a local prenatal education class. Begin classes no later than at the beginning of month 6 of your pregnancy. Ask for help if you have counseling or nutritional needs during pregnancy. Your health care provider can offer advice or refer you to specialists for help with various needs. Do not use hot tubs, steam rooms, or saunas. Do not douche or use tampons or scented sanitary pads. Do not cross your legs for long periods of time. Avoid cat litter boxes and soil used by cats. These carry germs that can cause birth defects in the baby and possibly loss of the fetus by miscarriage or stillbirth. Avoid all smoking, herbs, alcohol, and medicines not prescribed by your health care provider. Chemicals in these affect the formation and growth of the baby. Schedule a dentist appointment. At home, brush your teeth with a soft toothbrush and be gentle when you floss. SEEK MEDICAL CARE IF:   You have dizziness. You have mild pelvic cramps, pelvic pressure, or nagging pain in the abdominal area. You have persistent nausea, vomiting, or diarrhea. You have a bad smelling vaginal discharge. You have pain with urination. You notice increased swelling in your face, hands, legs, or ankles. SEEK IMMEDIATE MEDICAL CARE IF:  You have a fever. You are leaking fluid from your vagina. You have spotting or bleeding from your vagina. You have severe abdominal cramping or pain. You have rapid weight gain or loss. You vomit blood or material that looks like coffee grounds. You are exposed to German measles and have never had them. You are exposed to fifth disease or chickenpox. You develop a severe headache. You have shortness of breath. You have any kind of trauma, such as from a fall or a car accident. Document Released: 01/02/2001 Document Revised: 05/25/2013 Document Reviewed: 11/18/2012 ExitCare Patient Information 2015 [employer]. This information is not intended to replace advice given to you by your health care provider. Make sure you discuss any questions you have with your health care provider.  

## 2022-12-05 NOTE — Progress Notes (Signed)
INITIAL OBSTETRICAL VISIT Patient name: Amy Barrera MRN [id number]  Date of birth: 16-May-2000 Chief Complaint:   Initial Prenatal Visit ("Losing mucous plug" seeing streaks of blood occasionally, felt "pop" then pressure and something comes out. )  History of Present Illness:   Amy Barrera is a 22 y.o. G42P1001 Caucasian female at [redacted]w[redacted]d by LMP c/w u/s at 6.1 weeks with an Estimated Date of Delivery: 05/28/23 being seen today for her initial obstetrical visit.   Patient's last menstrual period was 08/21/2022. 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   Today she reports  back pain/cramping but stable; no N/V; notices 'mucous plug-like d/c sometimes with blood streaks' .  Last pap <21yo. Results were: N/A     12/05/2022    9:46 AM 04/27/2021    9:37 AM 01/12/2021    3:02 PM  Depression screen PHQ 2/9  Decreased Interest 0 0 0  Down, Depressed, Hopeless 0 0 0  PHQ - 2 Score 0 0 0  Altered sleeping 0 0 0  Tired, decreased energy 0 0 0  Change in appetite 0 0 0  Feeling bad or failure about yourself  0 0 0  Trouble concentrating 0 0 0  Moving slowly or fidgety/restless 0 0 0  Suicidal thoughts 0 0 0  PHQ-9 Score 0 0 0  Difficult doing work/chores   Not difficult at all        12/05/2022    9:46 AM 04/27/2021    9:37 AM 01/12/2021    3:02 PM  GAD 7 : Generalized Anxiety Score  Nervous, Anxious, on Edge 0 0 0  Control/stop worrying 0 0 0  Worry too much - different things 0 0 0  Trouble relaxing 0 0 0  Restless 0 0 0  Easily annoyed or irritable 0 0 0  Afraid - awful might happen 0 0 0  Total GAD 7 Score 0 0 0  Anxiety Difficulty   Not difficult at all     Review of Systems:   Pertinent items are noted in HPI Denies cramping/contractions, leakage of fluid, vaginal bleeding, abnormal vaginal discharge w/ itching/odor/irritation, headaches, visual changes, shortness of breath, chest pain, abdominal pain,  severe nausea/vomiting, or problems with urination or bowel movements unless otherwise stated above.  Pertinent History Reviewed:  Reviewed past medical,surgical, social, obstetrical and family history.  Reviewed problem list, medications and allergies. OB History  Gravida Para Term Preterm AB Living  2 1 1  0 0 1  SAB IAB Ectopic Multiple Live Births  0 0 0 0 1    # Outcome Date GA Lbr Len/2nd Weight Sex Type Anes PTL Lv  2 Current           1 Term 07/27/21 [redacted]w[redacted]d 04:57 / 01:30 7 lb 13 oz (3.544 kg) F Vag-Spont EPI N LIV     Complications: Gestational hypertension, Postpartum hemorrhage   Physical Assessment:   Vitals:   12/05/22 0943  BP: 127/83  Pulse: (!) 109  Weight: 192 lb (87.1 kg)  Body mass index is 29.19 kg/m.       Physical Examination:  General appearance - well appearing, and in no distress  Mental status - alert, oriented to person, place, and time  Psych:  She has a normal mood and affect  Skin - warm and dry, normal color, no suspicious lesions noted  Chest - effort normal, all lung fields clear to  auscultation bilaterally  Heart - normal rate and regular rhythm  Abdomen - soft, nontender  Extremities:  No swelling or varicosities noted  Pelvic - not indicated  Thin prep pap is not done    No results found for this or any previous visit (from the past 24 hour(s)).  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 Does have home bp cuff. Office bp cuff given: no. Rx sent: n/a. Check bp weekly, let us know if consistently >140/90.   Indications for ASA therapy (per uptodate) One of the following: H/O preeclampsia, especially early onset/adverse outcome Yes (hx gHTN)  No indications for early A1C (per uptodate)  Follow-up: Return in about 4 weeks (around 01/02/2023) for LROB, [place]: Anatomy.   Orders Placed This Encounter  Procedures   Urine Culture   US OB Comp + 14 Wk   Protein / creatinine ratio, urine   Comprehensive metabolic panel   PANORAMA PRENATAL TEST    Arabella Merles [facility] 12/05/2022 9:54 AM

## 2022-12-06 LAB — COMPREHENSIVE METABOLIC PANEL
ALT: 12 [IU]/L (ref 0–32)
AST: 14 [IU]/L (ref 0–40)
Albumin: 4 g/dL (ref 4.0–5.0)
Alkaline Phosphatase: 81 [IU]/L (ref 42–106)
BUN/Creatinine Ratio: 10 (ref 9–23)
BUN: 5 mg/dL — ABNORMAL LOW (ref 6–20)
Bilirubin Total: <0.2 mg/dL (ref 0.0–1.2)
CO2: 22 mmol/L (ref 20–29)
Calcium: 9.6 mg/dL (ref 8.7–10.2)
Chloride: 104 mmol/L (ref 96–106)
Creatinine, Ser: 0.52 mg/dL — ABNORMAL LOW (ref 0.57–1.00)
Globulin, Total: 3 g/dL (ref 1.5–4.5)
Glucose: 77 mg/dL (ref 70–99)
Potassium: 4.4 mmol/L (ref 3.5–5.2)
Sodium: 140 mmol/L (ref 134–144)
Total Protein: 7 g/dL (ref 6.0–8.5)
eGFR: 136 mL/min/{1.73_m2} (ref >59)

## 2022-12-06 LAB — PROTEIN / CREATININE RATIO, URINE
Creatinine, Urine: 34.7 mg/dL
Protein, Ur: 4.8 mg/dL
Protein/Creat Ratio: 138 mg/g{creat} (ref 0–200)

## 2022-12-07 LAB — URINE CULTURE

## 2022-12-15 LAB — PANORAMA PRENATAL TEST FULL PANEL:PANORAMA TEST PLUS 5 ADDITIONAL MICRODELETIONS: FETAL FRACTION: 9.5

## 2022-12-19 ENCOUNTER — Ambulatory Visit: Payer: MEDICAID | Attending: Internal Medicine | Admitting: Internal Medicine

## 2022-12-19 ENCOUNTER — Encounter: Payer: Self-pay | Admitting: Internal Medicine

## 2022-12-19 ENCOUNTER — Ambulatory Visit: Payer: MEDICAID

## 2022-12-19 VITALS — BP 134/78 | HR 109 | Ht 68.0 in | Wt 193.8 lb

## 2022-12-19 DIAGNOSIS — Z8249 Family history of ischemic heart disease and other diseases of the circulatory system: Secondary | ICD-10-CM | POA: Diagnosis not present

## 2022-12-19 DIAGNOSIS — I422 Other hypertrophic cardiomyopathy: Secondary | ICD-10-CM

## 2022-12-19 DIAGNOSIS — R000 Tachycardia, unspecified: Secondary | ICD-10-CM

## 2022-12-19 DIAGNOSIS — I4711 Inappropriate sinus tachycardia, so stated: Secondary | ICD-10-CM | POA: Insufficient documentation

## 2022-12-19 NOTE — Patient Instructions (Signed)
Medication Instructions:  Your physician recommends that you continue on your current medications as directed. Please refer to the Current Medication list given to you today.  *If you need a refill on your cardiac medications before your next appointment, please call your pharmacy*   Lab Work: None If you have labs (blood work) drawn today and your tests are completely normal, you will receive your results only by: MyChart Message (if you have MyChart) OR A paper copy in the mail If you have any lab test that is abnormal or we need to change your treatment, we will call you to review the results.   Testing/Procedures: Zio [place]ior    Follow-Up: At [facility], you and your health needs are our priority.  As part of our continuing mission to provide you with exceptional heart care, we have created designated Provider Care Teams.  These Care Teams include your primary Cardiologist (physician) and Advanced Practice Providers (APPs -  Physician Assistants and Nurse Practitioners) who all work together to provide you with the care you need, when you need it.  We recommend signing up for the patient portal called "MyChart".  Sign up information is provided on this After Visit Summary.  MyChart is used to connect with patients for Virtual Visits (Telemedicine).  Patients are able to view lab/test results, encounter notes, upcoming appointments, etc.  Non-urgent messages can be sent to your provider as well.   To learn more about what you can do with MyChart, go to [email or web address].    Your next appointment:   1 year(s)  Provider:   You may see Vishnu P Mallipeddi, MD or one of the following Advanced Practice Providers on your designated Care Team:   [place], PA-C  Jacolyn Reedy, [place]     Other Instructions

## 2022-12-19 NOTE — Progress Notes (Signed)
Cardiology Office Note  Date: 12/19/2022   ID: Amy Barrera, DOB Jun 09, 2000, MRN [id number]  PCP:  Pcp, No  Cardiologist:  Marjo Bicker, MD Electrophysiologist:  None    History of Present Illness: Amy Barrera is a 22 y.o. female known to have family history of HCM (maternal grand mother has HCM, maternal status unknown), palpitations is here for follow-up visit.  Patient has been following up with her pediatric cardiologist till she was 22 years old and currently wanted to switch to an adult cardiologist. She had serial echoes with them which showed no evidence of HCM and normal LVEF in 2021.  Echocardiogram in 2023 showed normal LVEF and no evidence of HCM.   Ongoing palpitations x 1 year, occurs almost daily, previously wore event monitor 11/2021 where her symptoms correlated with sinus tachycardia.  Metoprolol was started and was later self discontinued due to significant drop in blood pressures.  She also noticed that when she eats more salt, symptoms improved.  She is currently pregnant with her second baby, 17 weeks now.  No chest pain, DOE, dizziness, syncope.  Has very minimal leg swelling but not pitting.  Past Medical History:  Diagnosis Date   ADHD    Anxiety    Bipolar disorder (HCC)    Depression    Irritability and anger 11/30/2015   Self-mutilation    SVT (supraventricular tachycardia) (HCC)     Past Surgical History:  Procedure Laterality Date   NO PAST SURGERIES      Current Outpatient Medications  Medication Sig Dispense Refill   acetaminophen (TYLENOL) 325 MG tablet Take by mouth.     aspirin 81 MG chewable tablet Chew 2 tablets (162 mg total) by mouth daily. 60 tablet 7   Doxylamine Succinate, Sleep, (UNISOM PO) Take 25 mg by mouth at bedtime.     Prenatal Vit-Fe Fumarate-FA (PRENATAL VITAMIN PO) Take by mouth.     No current facility-administered medications for this visit.   Allergies:  Patient has no known allergies.   Social  History: The patient  reports that she has quit smoking. Her smoking use included cigarettes. She has never been exposed to tobacco smoke. She has never used smokeless tobacco. She reports that she does not currently use alcohol. She reports that she does not currently use drugs after having used the following drugs: Marijuana and Oxycodone.   Family History: The patient's family history includes Alcohol abuse in her father; Arthritis in her paternal grandmother; Bipolar disorder in her mother; Depression in her mother and paternal grandmother; Drug abuse in her father and mother; Early death in her father; Heart disease in her maternal grandmother. She was adopted.   ROS:  Please see the history of present illness. Otherwise, complete review of systems is positive for none.  All other systems are reviewed and negative.   Physical Exam: VS:  BP 134/78 (BP Location: Left Arm, Patient Position: Sitting, Cuff Size: Normal)   Pulse (!) 109   Ht 5\' 8"  (1.727 m)   Wt 193 lb 12.8 oz (87.9 kg)   LMP 08/21/2022   SpO2 99%   BMI 29.47 kg/m , BMI Body mass index is 29.47 kg/m.  Wt Readings from Last 3 Encounters:  12/19/22 193 lb 12.8 oz (87.9 kg)  12/05/22 192 lb (87.1 kg)  11/27/22 189 lb 12.8 oz (86.1 kg)    General: Patient appears comfortable at rest. HEENT: Conjunctiva and lids normal, oropharynx clear with moist mucosa. Neck: Supple, no elevated  JVP or carotid bruits, no thyromegaly. Lungs: Clear to auscultation, nonlabored breathing at rest. Cardiac: Regular rate and rhythm, no S3 or significant systolic murmur, no pericardial rub. Abdomen: Soft, nontender, no hepatomegaly, bowel sounds present, no guarding or rebound. Extremities: No pitting edema, distal pulses 2+. Skin: Warm and dry. Musculoskeletal: No kyphosis. Neuropsychiatric: Alert and oriented x3, affect grossly appropriate.  ECG:  An ECG dated 11/16/2021 was personally reviewed today and demonstrated:  Normal sinus rhythm with  marked sinus arrhythmia  Recent Labwork: 09/30/2022: Hemoglobin 11.7; Platelets 380 12/05/2022: ALT 12; AST 14; BUN 5; Creatinine, Ser 0.52; Potassium 4.4; Sodium 140     Component Value Date/Time   CHOL 195 (H) 05/27/2016 0645   TRIG 99 05/27/2016 0645   HDL 61 05/27/2016 0645   CHOLHDL 3.2 05/27/2016 0645   VLDL 20 05/27/2016 0645   LDLCALC 114 (H) 05/27/2016 0645    Other Studies Reviewed Today: Echo in 2021 No evidence of HCM Normal LVEF  Assessment and Plan:   Family history of HCM: Previously was following a pediatric cardiologist and getting serial echocardiograms.  Echocardiogram in 2023 showed normal LVEF, no evidence of HCM.  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  She was started on metoprolol but self discontinued due to drop in blood pressures.  Currently she is pregnant with her second baby, 17 weeks, will repeat event monitor to rule out any arrhythmias.  Will refer her to cardio OB for further management of palpitations in pregnancy.  Strongly encourage patient to increase water intake and salt intake 3 to 5 g daily (as she reported significant improvement in her symptoms when she increased salt).   I spent 30 minutes reviewing the prior notes, imaging studies, discussing management of palpitations and pregnancy.  Answered all her questions.  Medication Adjustments/Labs and Tests Ordered: Current medicines are reviewed at length with the patient today. Concerns regarding medicines are outlined above.   Tests Ordered: Orders Placed This Encounter  Procedures   EKG 12-Lead   EKG 12-Lead    Medication Changes: No orders of the defined types were placed in this encounter.   Disposition:  Follow up 1 year  Signed, Dwyane Dupree Verne Spurr, MD, 12/19/2022 3:58 PM    [facility] at [facility] [address] [place], [place]  [address]

## 2022-12-27 ENCOUNTER — Encounter: Payer: Self-pay | Admitting: Obstetrics & Gynecology

## 2022-12-28 ENCOUNTER — Encounter: Payer: Self-pay | Admitting: *Deleted

## 2022-12-28 ENCOUNTER — Ambulatory Visit: Payer: MEDICAID | Admitting: *Deleted

## 2022-12-28 VITALS — BP 130/81 | HR 102 | Wt 196.5 lb

## 2022-12-28 DIAGNOSIS — Z3480 Encounter for supervision of other normal pregnancy, unspecified trimester: Secondary | ICD-10-CM

## 2022-12-28 NOTE — Progress Notes (Signed)
   NURSE VISIT- BLOOD PRESSURE CHECK  SUBJECTIVE:  Amy Barrera is a 22 y.o. G5P1001 female here for BP check. She is [redacted]w[redacted]d pregnant    HYPERTENSION ROS:  Pregnant/postpartum:  Severe headaches that don't go away with tylenol/other medicines: Yes  Visual changes (seeing spots/double/blurred vision) Yes  Severe pain under right breast breast or in center of upper chest Yes  Severe nausea/vomiting Yes  Taking medicines as instructed not applicable    OBJECTIVE:  BP 130/81   Pulse (!) 102   Wt 196 lb 8 oz (89.1 kg)   LMP 08/21/2022   BMI 29.88 kg/m   Appearance alert, well appearing, and in no distress.  ASSESSMENT: Pregnancy [redacted]w[redacted]d  blood pressure check  PLAN: Discussed with Dr. Despina Hidden   Recommendations: no changes needed   Follow-up: as scheduled   Malachy Mood  12/28/2022 11:45 AM

## 2023-01-02 ENCOUNTER — Other Ambulatory Visit: Payer: Self-pay | Admitting: Advanced Practice Midwife

## 2023-01-02 DIAGNOSIS — Z3480 Encounter for supervision of other normal pregnancy, unspecified trimester: Secondary | ICD-10-CM

## 2023-01-02 DIAGNOSIS — Z363 Encounter for antenatal screening for malformations: Secondary | ICD-10-CM

## 2023-01-02 DIAGNOSIS — Z8759 Personal history of other complications of pregnancy, childbirth and the puerperium: Secondary | ICD-10-CM

## 2023-01-02 DIAGNOSIS — I422 Other hypertrophic cardiomyopathy: Secondary | ICD-10-CM

## 2023-01-02 DIAGNOSIS — R000 Tachycardia, unspecified: Secondary | ICD-10-CM

## 2023-01-02 DIAGNOSIS — Z3A15 15 weeks gestation of pregnancy: Secondary | ICD-10-CM

## 2023-01-07 ENCOUNTER — Ambulatory Visit: Payer: MEDICAID | Admitting: Radiology

## 2023-01-07 ENCOUNTER — Encounter: Payer: Self-pay | Admitting: Women's Health

## 2023-01-07 ENCOUNTER — Other Ambulatory Visit: Payer: MEDICAID | Admitting: Radiology

## 2023-01-07 ENCOUNTER — Ambulatory Visit (INDEPENDENT_AMBULATORY_CARE_PROVIDER_SITE_OTHER): Payer: MEDICAID | Admitting: Women's Health

## 2023-01-07 ENCOUNTER — Encounter: Payer: MEDICAID | Admitting: Women's Health

## 2023-01-07 VITALS — BP 132/76 | HR 96 | Wt 198.5 lb

## 2023-01-07 DIAGNOSIS — I422 Other hypertrophic cardiomyopathy: Secondary | ICD-10-CM | POA: Diagnosis not present

## 2023-01-07 DIAGNOSIS — Z3A19 19 weeks gestation of pregnancy: Secondary | ICD-10-CM

## 2023-01-07 DIAGNOSIS — Z8759 Personal history of other complications of pregnancy, childbirth and the puerperium: Secondary | ICD-10-CM | POA: Diagnosis not present

## 2023-01-07 DIAGNOSIS — Z3482 Encounter for supervision of other normal pregnancy, second trimester: Secondary | ICD-10-CM

## 2023-01-07 DIAGNOSIS — Z363 Encounter for antenatal screening for malformations: Secondary | ICD-10-CM

## 2023-01-07 DIAGNOSIS — Z3480 Encounter for supervision of other normal pregnancy, unspecified trimester: Secondary | ICD-10-CM

## 2023-01-07 MED ORDER — CLINDAMYCIN PHOSPHATE 1 % EX GEL: Freq: Two times a day (BID) | CUTANEOUS | 6 refills | Status: DC | PRN

## 2023-01-07 NOTE — Progress Notes (Signed)
LOW-RISK PREGNANCY VISIT Patient name: Amy Barrera MRN [id number]  Date of birth: 05-06-2000 Chief Complaint:   Routine Prenatal Visit ([place] today!!!)  History of Present Illness:   Amy Barrera is a 22 y.o. G33P1001 female at [redacted]w[redacted]d with an Estimated Date of Delivery: 05/28/23 being seen today for ongoing management of a low-risk pregnancy.   Today she reports  has tried everything otc for acne, nothing helping, requests clindagel . Contractions: Irritability. Vag. Bleeding: None.  Movement: Present. denies leaking of fluid.     12/05/2022    9:46 AM 04/27/2021    9:37 AM 01/12/2021    3:02 PM  Depression screen PHQ 2/9  Decreased Interest 0 0 0  Down, Depressed, Hopeless 0 0 0  PHQ - 2 Score 0 0 0  Altered sleeping 0 0 0  Tired, decreased energy 0 0 0  Change in appetite 0 0 0  Feeling bad or failure about yourself  0 0 0  Trouble concentrating 0 0 0  Moving slowly or fidgety/restless 0 0 0  Suicidal thoughts 0 0 0  PHQ-9 Score 0 0 0  Difficult doing work/chores   Not difficult at all        12/05/2022    9:46 AM 04/27/2021    9:37 AM 01/12/2021    3:02 PM  GAD 7 : Generalized Anxiety Score  Nervous, Anxious, on Edge 0 0 0  Control/stop worrying 0 0 0  Worry too much - different things 0 0 0  Trouble relaxing 0 0 0  Restless 0 0 0  Easily annoyed or irritable 0 0 0  Afraid - awful might happen 0 0 0  Total GAD 7 Score 0 0 0  Anxiety Difficulty   Not difficult at all      Review of Systems:   Pertinent items are noted in HPI Denies abnormal vaginal discharge w/ itching/odor/irritation, headaches, visual changes, shortness of breath, chest pain, abdominal pain, severe nausea/vomiting, or problems with urination or bowel movements unless otherwise stated above. Pertinent History Reviewed:  Reviewed past medical,surgical, social, obstetrical and family history.  Reviewed problem list, medications and allergies. Physical Assessment:   Vitals:   01/07/23 1131   BP: 132/76  Pulse: 96  Weight: 198 lb 8 oz (90 kg)  Body mass index is 30.18 kg/m.        Physical Examination:   General appearance: Well appearing, and in no distress  Mental status: Alert, oriented to person, place, and time  Skin: Warm & dry  Cardiovascular: Normal heart rate noted  Respiratory: Normal respiratory effort, no distress  Abdomen: Soft, gravid, nontender  Pelvic: Cervical exam deferred         Extremities: Edema: Trace  Fetal Status:     Movement: Present   GA 19+6 Single active female fetus, cephalic, FHR140bpm,  normal amn fluid = MVP=4.4 cm Anterior pl high, gr1  EFW 92%  AC83%  Anatomy screen complete, no abn seen Normal Rt ov - Left ov not seen - neg and regions  CL = 4.6 cm,  closed  Chaperone: N/A   No results found for this or any previous visit (from the past 24 hours).  Assessment & Plan:  1) Low-risk pregnancy G2P1001 at [redacted]w[redacted]d with an Estimated Date of Delivery: 05/28/23   2) Acne, rx clindagel  3) LGA> consider 36w EFW   Meds:  Meds ordered this encounter  Medications   clindamycin (CLINDAGEL) 1 % gel    Sig:  Apply topically 2 (two) times daily as needed (acne).    Dispense:  30 g    Refill:  6   Labs/procedures today: U/S  Plan:  Continue routine obstetrical care  Next visit: prefers in person    Reviewed: Preterm labor symptoms and general obstetric precautions including but not limited to vaginal bleeding, contractions, leaking of fluid and fetal movement were reviewed in detail with the patient.  All questions were answered. Does have home bp cuff. Office bp cuff given: not applicable. Check bp weekly, let us know if consistently >140 and/or >90.  Follow-up: Return in about 4 weeks (around 02/04/2023) for LROB, CNM, in person.  Future Appointments  Date Time Provider Department Center  02/08/2023  1:40 PM Tobb, Lavona [place], DO [facility] None    No orders of the defined types were placed in this encounter.  Cheral Marker CNM,  [facility] 01/07/2023 12:01 PM

## 2023-01-07 NOTE — Patient Instructions (Signed)
Sahasra, thank you for choosing our office today! We appreciate the opportunity to meet your healthcare needs. You may receive a short survey by mail, e-mail, or through [employer]. If you are happy with your care we would appreciate if you could take just a few minutes to complete the survey questions. We read all of your comments and take your feedback very seriously. Thank you again for choosing our office.  Center for [employer] Team at [place] at [facility] ([place], [place] [address]) Entrance C, located off of [address] parking  Go to [email or web address] to register for FREE online childbirth classes  Call the office [phone]) or go to [facility] if: You begin to severe cramping Your water breaks.  Sometimes it is a big gush of fluid, sometimes it is just a trickle that keeps getting your panties wet or running down your legs You have vaginal bleeding.  It is normal to have a small amount of spotting if your cervix was checked.   [facility] Pediatricians/Family Doctors [facility]): [address] [place], [facility]: [address] [place], [phone]                [facility]): [place], [phone] (call to ask if accepting patients) [facility] Department: [place] [address], [place], [place] Pediatricians/Family Doctors [facility]): [place]. [place], [address], [place] [facility]: [address] [place], [place] [facility]: [place], [place] [facility]): [phone] [facility]: [address], [place]: [address] [facility]: [phone], [phone]  Home Blood Pressure Monitoring for Patients   Your provider has recommended that you check your blood pressure (BP) at least once a week at home. If you do not have a blood pressure cuff at home, one will be provided for you. Contact your provider if you have not received your monitor within 1 week.   Helpful Tips for Accurate Home Blood Pressure Checks  Don't smoke, exercise, or drink caffeine 30 minutes before checking your BP Use the restroom before checking your BP (a full bladder can raise your pressure) Relax in a comfortable upright chair Feet on the ground Left arm resting comfortably on a flat surface at the level of your heart Legs uncrossed Back supported Sit quietly and don't talk Place the cuff on your bare arm Adjust snuggly, so that only two fingertips can fit between your skin and the top of the cuff Check 2 readings separated by at least one minute Keep a log of your BP readings For a visual, please reference this diagram: http://ccnc.care/bpdiagram  Provider Name: [facility]     Phone: [phone]  Zone 1: ALL CLEAR  Continue to monitor your symptoms:  BP reading is less than 140 (top number) or less than 90 (bottom number)  No right upper stomach pain No headaches or seeing spots No feeling nauseated or throwing up No swelling in face and hands  Zone 2: CAUTION Call your doctor's office for any of the following:  BP reading is greater than 140 (top number) or greater than  90 (bottom number)  Stomach pain under your ribs in the middle or right side Headaches or seeing spots Feeling nauseated or throwing up Swelling in face and hands  Zone 3: EMERGENCY  Seek immediate medical care if you have any of the following:  BP reading is greater than160 (top number) or greater than 110 (bottom number) Severe headaches not improving with Tylenol Serious difficulty catching your breath Any worsening symptoms from  Zone 2     Second Trimester of Pregnancy The second trimester is from week 14 through week 27 (months 4 through 6). The second trimester is often a time when you feel your best. Your body has adjusted to being pregnant, and you begin to feel better physically. Usually, morning sickness has lessened or quit completely, you may have more energy, and you may have an increase in appetite. The second trimester is also a time when the fetus is growing rapidly. At the end of the sixth month, the fetus is about 9 inches long and weighs about 1 pounds. You will likely begin to feel the baby move (quickening) between 16 and 20 weeks of pregnancy. Body changes during your second trimester Your body continues to go through many changes during your second trimester. The changes vary from woman to woman. Your weight will continue to increase. You will notice your lower abdomen bulging out. You may begin to get stretch marks on your hips, abdomen, and breasts. You may develop headaches that can be relieved by medicines. The medicines should be approved by your health care provider. You may urinate more often because the fetus is pressing on your bladder. You may develop or continue to have heartburn as a result of your pregnancy. You may develop constipation because certain hormones are causing the muscles that push waste through your intestines to slow down. You may develop hemorrhoids or swollen, bulging veins (varicose veins). You may have back pain. This is caused by: Weight gain. Pregnancy hormones that are relaxing the joints in your pelvis. A shift in weight and the muscles that support your balance. Your breasts will continue to grow and they will continue to become tender. Your gums may bleed and may be sensitive to brushing and flossing. Dark spots or blotches (chloasma, mask of pregnancy) may develop on your face. This will likely fade after the baby is born. A dark line from your belly button to  the pubic area (linea nigra) may appear. This will likely fade after the baby is born. You may have changes in your hair. These can include thickening of your hair, rapid growth, and changes in texture. Some women also have hair loss during or after pregnancy, or hair that feels dry or thin. Your hair will most likely return to normal after your baby is born.  What to expect at prenatal visits During a routine prenatal visit: You will be weighed to make sure you and the fetus are growing normally. Your blood pressure will be taken. Your abdomen will be measured to track your baby's growth. The fetal heartbeat will be listened to. Any test results from the previous visit will be discussed.  Your health care provider may ask you: How you are feeling. If you are feeling the baby move. If you have had any abnormal symptoms, such as leaking fluid, bleeding, severe headaches, or abdominal cramping. If you are using any tobacco products, including cigarettes, chewing tobacco, and electronic cigarettes. If you have any questions.  Other tests that may be performed during  your second trimester include: Blood tests that check for: Low iron levels (anemia). High blood sugar that affects pregnant women (gestational diabetes) between 41 and 28 weeks. Rh antibodies. This is to check for a protein on red blood cells (Rh factor). Urine tests to check for infections, diabetes, or protein in the urine. An ultrasound to confirm the proper growth and development of the baby. An amniocentesis to check for possible genetic problems. Fetal screens for spina bifida and Down syndrome. HIV (human immunodeficiency virus) testing. Routine prenatal testing includes screening for HIV, unless you choose not to have this test.  Follow these instructions at home: Medicines Follow your health care provider's instructions regarding medicine use. Specific medicines may be either safe or unsafe to take during  pregnancy. Take a prenatal vitamin that contains at least 600 micrograms (mcg) of folic acid. If you develop constipation, try taking a stool softener if your health care provider approves. Eating and drinking Eat a balanced diet that includes fresh fruits and vegetables, whole grains, good sources of protein such as meat, eggs, or tofu, and low-fat dairy. Your health care provider will help you determine the amount of weight gain that is right for you. Avoid raw meat and uncooked cheese. These carry germs that can cause birth defects in the baby. If you have low calcium intake from food, talk to your health care provider about whether you should take a daily calcium supplement. Limit foods that are high in fat and processed sugars, such as fried and sweet foods. To prevent constipation: Drink enough fluid to keep your urine clear or pale yellow. Eat foods that are high in fiber, such as fresh fruits and vegetables, whole grains, and beans. Activity Exercise only as directed by your health care provider. Most women can continue their usual exercise routine during pregnancy. Try to exercise for 30 minutes at least 5 days a week. Stop exercising if you experience uterine contractions. Avoid heavy lifting, wear low heel shoes, and practice good posture. A sexual relationship may be continued unless your health care provider directs you otherwise. Relieving pain and discomfort Wear a good support bra to prevent discomfort from breast tenderness. Take warm sitz baths to soothe any pain or discomfort caused by hemorrhoids. Use hemorrhoid cream if your health care provider approves. Rest with your legs elevated if you have leg cramps or low back pain. If you develop varicose veins, wear support hose. Elevate your feet for 15 minutes, 3-4 times a day. Limit salt in your diet. Prenatal Care Write down your questions. Take them to your prenatal visits. Keep all your prenatal visits as told by your health  care provider. This is important. Safety Wear your seat belt at all times when driving. Make a list of emergency phone numbers, including numbers for family, friends, the hospital, and police and fire departments. General instructions Ask your health care provider for a referral to a local prenatal education class. Begin classes no later than the beginning of month 6 of your pregnancy. Ask for help if you have counseling or nutritional needs during pregnancy. Your health care provider can offer advice or refer you to specialists for help with various needs. Do not use hot tubs, steam rooms, or saunas. Do not douche or use tampons or scented sanitary pads. Do not cross your legs for long periods of time. Avoid cat litter boxes and soil used by cats. These carry germs that can cause birth defects in the baby and possibly loss of the  fetus by miscarriage or stillbirth. Avoid all smoking, herbs, alcohol, and unprescribed drugs. Chemicals in these products can affect the formation and growth of the baby. Do not use any products that contain nicotine or tobacco, such as cigarettes and e-cigarettes. If you need help quitting, ask your health care provider. Visit your dentist if you have not gone yet during your pregnancy. Use a soft toothbrush to brush your teeth and be gentle when you floss. Contact a health care provider if: You have dizziness. You have mild pelvic cramps, pelvic pressure, or nagging pain in the abdominal area. You have persistent nausea, vomiting, or diarrhea. You have a bad smelling vaginal discharge. You have pain when you urinate. Get help right away if: You have a fever. You are leaking fluid from your vagina. You have spotting or bleeding from your vagina. You have severe abdominal cramping or pain. You have rapid weight gain or weight loss. You have shortness of breath with chest pain. You notice sudden or extreme swelling of your face, hands, ankles, feet, or legs. You  have not felt your baby move in over an hour. You have severe headaches that do not go away when you take medicine. You have vision changes. Summary The second trimester is from week 14 through week 27 (months 4 through 6). It is also a time when the fetus is growing rapidly. Your body goes through many changes during pregnancy. The changes vary from woman to woman. Avoid all smoking, herbs, alcohol, and unprescribed drugs. These chemicals affect the formation and growth your baby. Do not use any tobacco products, such as cigarettes, chewing tobacco, and e-cigarettes. If you need help quitting, ask your health care provider. Contact your health care provider if you have any questions. Keep all prenatal visits as told by your health care provider. This is important. This information is not intended to replace advice given to you by your health care provider. Make sure you discuss any questions you have with your health care provider. Document Released: 01/02/2001 Document Revised: 06/16/2015 Document Reviewed: 03/11/2012 Elsevier Interactive Patient Education  2017 [employer].

## 2023-01-07 NOTE — Progress Notes (Signed)
GA 19+6 Single active female fetus, cephalic, FHR140bpm,  normal amn fluid = MVP=4.4 cm Anterior pl high, gr1  EFW 92%  AC86%  Anatomy screen complete, no abn seen Normal Rt ov - Left ov not seen - neg and regions  CL = 4.6 cm,  closed

## 2023-01-16 ENCOUNTER — Inpatient Hospital Stay (HOSPITAL_COMMUNITY)
Admission: AD | Admit: 2023-01-16 | Discharge: 2023-01-16 | Disposition: A | Discharge status: Home / Self Care | Payer: MEDICAID | Attending: Obstetrics and Gynecology | Admitting: Obstetrics and Gynecology

## 2023-01-16 ENCOUNTER — Other Ambulatory Visit: Payer: Self-pay

## 2023-01-16 DIAGNOSIS — Z3A21 21 weeks gestation of pregnancy: Secondary | ICD-10-CM | POA: Insufficient documentation

## 2023-01-16 DIAGNOSIS — M545 Low back pain, unspecified: Secondary | ICD-10-CM | POA: Diagnosis not present

## 2023-01-16 DIAGNOSIS — N949 Unspecified condition associated with female genital organs and menstrual cycle: Secondary | ICD-10-CM

## 2023-01-16 DIAGNOSIS — O26892 Other specified pregnancy related conditions, second trimester: Secondary | ICD-10-CM | POA: Diagnosis present

## 2023-01-16 DIAGNOSIS — Z3480 Encounter for supervision of other normal pregnancy, unspecified trimester: Secondary | ICD-10-CM

## 2023-01-16 DIAGNOSIS — R102 Pelvic and perineal pain: Secondary | ICD-10-CM | POA: Diagnosis not present

## 2023-01-16 LAB — URINALYSIS, ROUTINE W REFLEX MICROSCOPIC
Bilirubin Urine: NEGATIVE
Glucose, UA: NEGATIVE mg/dL
Hgb urine dipstick: NEGATIVE
Ketones, ur: NEGATIVE mg/dL
Leukocytes,Ua: NEGATIVE
Nitrite: NEGATIVE
Protein, ur: NEGATIVE mg/dL
Specific Gravity, Urine: 1.012 (ref 1.005–1.030)
pH: 7 (ref 5.0–8.0)

## 2023-01-16 LAB — WET PREP, GENITAL
Clue Cells Wet Prep HPF POC: NONE SEEN
Sperm: NONE SEEN
Trich, Wet Prep: NONE SEEN
WBC, Wet Prep HPF POC: <10 (ref <10)
Yeast Wet Prep HPF POC: NONE SEEN

## 2023-01-16 MED ORDER — CYCLOBENZAPRINE HCL 10 MG PO TABS: 10.0000 mg | ORAL_TABLET | Freq: Three times a day (TID) | ORAL | 0 refills | Status: AC | PRN

## 2023-01-16 MED ORDER — CYCLOBENZAPRINE HCL 5 MG PO TABS
10.0000 mg | ORAL_TABLET | Freq: Three times a day (TID) | ORAL | Status: DC | PRN
  Administered 2023-01-16: 10 mg via ORAL
  Filled 2023-01-16: qty 2

## 2023-01-16 MED ORDER — ACETAMINOPHEN 500 MG PO TABS
1000.0000 mg | ORAL_TABLET | Freq: Once | ORAL | Status: AC
Start: 2023-01-16 — End: 2023-01-16
  Administered 2023-01-16: 1000 mg via ORAL
  Filled 2023-01-16: qty 2

## 2023-01-16 NOTE — MAU Note (Signed)
Amy Barrera is a 22 y.o. at [redacted]w[redacted]d here in MAU reporting: pain started at midnight in lower back,,going down into hips.  Has started cramping in lower abd, kind of like period cramps but worse.  Almost feels like how labor started.  Has lost a little chunk of her mucous plug. No bleeding. No leaking. Took some Tylenol, little relief.  Took some Gasx. No constipated.  No vomiting or diarrhea. Little nausea. No urinary symptoms- neg CVA tenderness. Onset of complaint: midnight Pain score: back 7, abd 5 Vitals:   01/16/23 1254  BP: 138/85  Pulse: (!) 119  Resp: 20  Temp: 97.9 F (36.6 C)  SpO2: 100%     FHT:146 Lab orders placed from triage:  UA

## 2023-01-16 NOTE — Discharge Instructions (Signed)
Round Ligament Massage & Stretches  Massage: Starting at the middle of your pubic bone, trace little circles in a wide U from your pubic bone to your hip bones on both sides.  Then starting just above your pubic bone, press in and down, alternating sides to create a gentle rocking of your uterus back and forth.  Move your hands up the sides of your belly and back down. Do this 3-5 times upon waking and before bed.  Stretches: Get on hands and knees and alternate arching your back deeply while inhaling, and then rounding your back while exhaling. Modified runners lunge:  - Sit on a chair with half of your bottom on the chair and half off.  - Sit up tall, plant your front foot, and stretch your other foot out behind you.  - Breathe deeply for 5 breaths and then do the other side.  Chronic Low Back Pain  [email or web address].pdf ([email or web address])

## 2023-01-16 NOTE — MAU Provider Note (Signed)
LBP, pelvic cramping    S [name] is a 22 y.o. G2P1001 pregnant female at [redacted]w[redacted]d who presents to MAU today with complaint of LBP and pelvic cramping that feels like "start of labor."  Pain started at 0001 12/25 in low back going down into bilateral hips.  Started having lower abdominal cramping like menses just more severe.  States lost a "little chunk of mucous plug."  Denies VB, LOF.  Reports taking Tylenol and Gasx w/ only little relief. Denies constipation, vomiting, changes in urination / flank pain, diarrhea.  Some nausea reported.   Receives care at [facility]. Prenatal records reviewed.  Pertinent items noted in HPI and remainder of comprehensive ROS otherwise negative.   O BP 138/85 (BP Location: Right Arm)   Pulse (!) 119   Temp 97.9 F (36.6 C) (Oral)   Resp 20   Ht 5\' 8"  (1.727 m)   Wt 91.9 kg   LMP 08/21/2022   SpO2 100%   BMI 30.79 kg/m  Physical Exam Vitals and nursing note reviewed. Exam conducted with a chaperone present.  Constitutional:      General: She is not in acute distress.    Appearance: She is well-developed. She is not ill-appearing.  HENT:     Head: Normocephalic and atraumatic.  Eyes:     Extraocular Movements: Extraocular movements intact.  Cardiovascular:     Rate and Rhythm: Normal rate.  Pulmonary:     Effort: Pulmonary effort is normal. No respiratory distress.  Abdominal:     Tenderness: There is abdominal tenderness (bilateral round ligment distribution relieved with lifting up gravid abdomen).     Comments: gravid  Genitourinary:    Vagina: Normal. No foreign body. No bleeding.     Cervix: Normal. No discharge.     Comments: Cervical exam C/L/H, anterior cervix  Musculoskeletal:       Arms:     Comments: Paraspinal TTP of muscle with radiation into bilateral hip girdle   Skin:    General: Skin is warm and dry.  Neurological:     Mental Status: She is alert and oriented to person, place, and time.     Motor: No weakness.   Psychiatric:        Mood and Affect: Mood is anxious.        Behavior: Behavior normal.      MDM: MAU Course:  UA non infectious  Wet Prep pending at time of discharge GC collected   Pt with paraspinal TTP and round ligament tenderness with massage.  Significant relief in pelvic cramping and some LBP with lifting of her gravid abdomen.  Reassuring cervical exam. Suspicion for round ligament pain and MSK pain of paraspinal low back.  Will give flexeril and tylenol and see if relief.   Patient with complete resolution of all her symptoms.  Would like Flexeril prescription sent in for her as well.  Stable for d/c.    A&P: #[redacted] weeks gestation #LBP #Round ligament pain - massage exercises given at time of d/c - encouraged belly bands / K taping  - flexeril PRN rx sent in to desired pharmacy   Discharge from MAU in stable condition with strict/usual precautions Follow up at [facility] as scheduled for ongoing prenatal care  Allergies as of 01/16/2023   No Known Allergies      Medication List     TAKE these medications    acetaminophen 325 MG tablet Commonly known as: TYLENOL Take by mouth.   aspirin  81 MG chewable tablet Chew 2 tablets (162 mg total) by mouth daily.   clindamycin 1 % gel Commonly known as: CLINDAGEL Apply topically 2 (two) times daily as needed (acne).   cyclobenzaprine 10 MG tablet Commonly known as: FLEXERIL Take 1 tablet (10 mg total) by mouth 3 (three) times daily as needed for muscle spasms.   FAMOTIDINE PO Take by mouth.   PRENATAL VITAMIN PO Take by mouth.   UNISOM PO Take 25 mg by mouth at bedtime.        Hessie Dibble, MD 01/16/2023 2:22 PM

## 2023-01-17 LAB — GC/CHLAMYDIA PROBE AMP (~~LOC~~) NOT AT ARMC
Chlamydia: NEGATIVE
Comment: NEGATIVE
Comment: NORMAL
Neisseria Gonorrhea: NEGATIVE

## 2023-01-23 NOTE — L&D Delivery Note (Signed)
 OB/GYN Faculty Practice Delivery Note  Amy Barrera is a 23 y.o. G2P1001 s/p SVD at [redacted]w[redacted]d. She was admitted for IOL for GHTN after ECV.   ROM: 11h 54m with clear fluid GBS Status:  Negative/-- (04/08 1435)  Maximum Maternal Temperature: 98.93F  Labor Progress: Initial SVE: 2.5/thick/-3. Ripened with Dual Cytotec . Augmented with AROM and 2u Pitocin . Epidural. She then progressed to complete.   Delivery Date/Time: 05/11/2023 @0338   Delivery: Called to room and patient was complete and pushing. Head delivered LOA with 3 maternal efforts. No nuchal cord present. Shoulder and body delivered in usual fashion. Infant with spontaneous cry, placed on mother's abdomen, dried and stimulated. Cord clamped x 2 after 1-minute delay, and cut by FOB. Cord blood drawn. Placenta delivered spontaneously with gentle cord traction. Fundus firm with massage and Pitocin . Labia, perineum, vagina, and cervix inspected without lesion.   Baby Weight: 3650g  Placenta: 3 vessel, intact. Sent to L&D Complications: None Lacerations: None EBL: 322 mL Analgesia: Epidural   Infant:  APGAR (1 MIN): 8  APGAR (5 MINS): 9   [place], MD OB Family Medicine Fellow, [facility] for [facility], [facility] 05/11/2023, 4:01 AM

## 2023-02-05 ENCOUNTER — Encounter: Payer: Self-pay | Admitting: Women's Health

## 2023-02-05 ENCOUNTER — Ambulatory Visit (INDEPENDENT_AMBULATORY_CARE_PROVIDER_SITE_OTHER): Payer: MEDICAID | Admitting: Women's Health

## 2023-02-05 VITALS — BP 131/77 | HR 101 | Wt 209.0 lb

## 2023-02-05 DIAGNOSIS — Z3482 Encounter for supervision of other normal pregnancy, second trimester: Secondary | ICD-10-CM

## 2023-02-05 DIAGNOSIS — F418 Other specified anxiety disorders: Secondary | ICD-10-CM

## 2023-02-05 DIAGNOSIS — Z3A24 24 weeks gestation of pregnancy: Secondary | ICD-10-CM

## 2023-02-05 NOTE — Progress Notes (Signed)
 LOW-RISK PREGNANCY VISIT Patient name: Amy Barrera MRN [id number]  Date of birth: 2000-11-28 Chief Complaint:   Routine Prenatal Visit (Still having lower abd pain)  History of Present Illness:   Amy Barrera is a 23 y.o. G63P1001 female at [redacted]w[redacted]d with an Estimated Date of Delivery: 05/28/23 being seen today for ongoing management of a low-risk pregnancy.   Today she reports  anxiety- has been on meds in past but would rather not restart unless necessary, is interested in [facility] referral. Occ intense cramping, maybe 2x/day, feels like contractions. Went to [facility] for same 12/25, all wnl . Contractions: Not present. Vag. Bleeding: None.  Movement: Present. denies leaking of fluid.     12/05/2022    9:46 AM 04/27/2021    9:37 AM 01/12/2021    3:02 PM  Depression screen PHQ 2/9  Decreased Interest 0 0 0  Down, Depressed, Hopeless 0 0 0  PHQ - 2 Score 0 0 0  Altered sleeping 0 0 0  Tired, decreased energy 0 0 0  Change in appetite 0 0 0  Feeling bad or failure about yourself  0 0 0  Trouble concentrating 0 0 0  Moving slowly or fidgety/restless 0 0 0  Suicidal thoughts 0 0 0  PHQ-9 Score 0 0 0  Difficult doing work/chores   Not difficult at all        12/05/2022    9:46 AM 04/27/2021    9:37 AM 01/12/2021    3:02 PM  GAD 7 : Generalized Anxiety Score  Nervous, Anxious, on Edge 0 0 0  Control/stop worrying 0 0 0  Worry too much - different things 0 0 0  Trouble relaxing 0 0 0  Restless 0 0 0  Easily annoyed or irritable 0 0 0  Afraid - awful might happen 0 0 0  Total GAD 7 Score 0 0 0  Anxiety Difficulty   Not difficult at all      Review of Systems:   Pertinent items are noted in HPI Denies abnormal vaginal discharge w/ itching/odor/irritation, headaches, visual changes, shortness of breath, chest pain, abdominal pain, severe nausea/vomiting, or problems with urination or bowel movements unless otherwise stated above. Pertinent History Reviewed:  Reviewed past  medical,surgical, social, obstetrical and family history.  Reviewed problem list, medications and allergies. Physical Assessment:   Vitals:   02/05/23 1157  BP: 131/77  Pulse: (!) 101  Weight: 209 lb (94.8 kg)  Body mass index is 31.78 kg/m.        Physical Examination:   General appearance: Well appearing, and in no distress  Mental status: Alert, oriented to person, place, and time  Skin: Warm & dry  Cardiovascular: Normal heart rate noted  Respiratory: Normal respiratory effort, no distress  Abdomen: Soft, gravid, nontender  Pelvic: Cervical exam deferred         Extremities: Edema: None  Fetal Status: Fetal Heart Rate (bpm): 145 Fundal Height: 24 cm Movement: Present    Chaperone: N/A   No results found for this or any previous visit (from the past 24 hours).  Assessment & Plan:  1) Low-risk pregnancy G2P1001 at [redacted]w[redacted]d with an Estimated Date of Delivery: 05/28/23   2) H/O gHTN/PPHTN, ASA  3) Dep/anx- no meds, [facility] referral today  4) Occ contractions> discussed, reviewed ptl s/s, reasons to seek care   Meds: No orders of the defined types were placed in this encounter.  Labs/procedures today: none  Plan:  Continue routine obstetrical  care  Next visit: prefers will be in person for pn2     Reviewed: Preterm labor symptoms and general obstetric precautions including but not limited to vaginal bleeding, contractions, leaking of fluid and fetal movement were reviewed in detail with the patient.  All questions were answered. Does have home bp cuff. Office bp cuff given: not applicable. Check bp weekly, let us  know if consistently >140 and/or >90.  Follow-up: Return in about 4 weeks (around 03/05/2023) for LROB, PN2, CNM, in person.  Future Appointments  Date Time Provider Department Center  02/08/2023  1:40 PM Tobb, Kardie, DO [facility] None    Orders Placed This Encounter  Procedures   Amb ref to [facility]   Suzen JONELLE Fetters CNM,  [facility] 02/05/2023 12:31 PM

## 2023-02-05 NOTE — Patient Instructions (Signed)
 Aev[place], thank you for choosing our office today! We appreciate the opportunity to meet your healthcare needs. You may receive a short survey by mail, e-mail, or through [employer]. If you are happy with your care we would appreciate if you could take just a few minutes to complete the survey questions. We read all of your comments and take your feedback very seriously. Thank you again for choosing our office.  Center for [employer] Team at [facility]  [facility] at [facility] ([address] [place], [place] [address]) Entrance C, located off of [address] Free 24/7 valet parking   You will have your sugar test next visit.  Please do not eat or drink anything after midnight the night before you come, not even water.  You will be here for at least two hours.  Please make an appointment online for the bloodwork at [email or web address] for 8:00am (or as close to this as possible). Make sure you select the [facility] service center.   CLASSES: Go to [email or web address] to register for classes (childbirth, breastfeeding, waterbirth, infant CPR, daddy bootcamp, etc.)  Call the office [phone]) or go to [facility] if: You begin to have strong, frequent contractions Your water breaks.  Sometimes it is a big gush of fluid, sometimes it is just a trickle that keeps getting your panties wet or running down your legs You have vaginal bleeding.  It is normal to have a small amount of spotting if your cervix was checked.  You don't feel your baby moving like normal.  If you don't, get you something to eat and drink and lay down and focus on feeling your baby move.   If your baby is still not moving like normal, you should call the office or go to [facility].  Call the office [phone]) or go to [facility] hospital for these signs of pre-eclampsia: Severe headache that does not go away with Tylenol  Visual changes- seeing spots, double, blurred vision Pain under your right breast or upper  abdomen that does not go away with Tums or heartburn medicine Nausea and/or vomiting Severe swelling in your hands, feet, and face    [place] Pediatricians/Family Doctors [facility]): [address] [place], [phone]           [facility]: [address], [phone]                [facility]): [address], [phone]  [facility] Department: [address], [place], [phone]    [facility] Pediatricians/Family Doctors [facility]): [address], [phone] [facility]: [address] [place], [phone] [facility] [place]: [address], [phone]  [facility] Doctors  [facility]): [phone] [facility]: [address], [phone]   [facility] Doctors [facility]: [address] [phone]  [facility] Doctors  [facility]: [phone], [phone]  Home Blood Pressure Monitoring for Patients   Your provider has recommended that you check your blood pressure (BP) at least once a week at home. If you do not have a blood pressure cuff at home, one will be provided for you. Contact your provider if you have not received your monitor within 1 week.   Helpful Tips for Accurate Home Blood Pressure Checks  Don't smoke, exercise, or drink caffeine  30 minutes before checking  your BP Use the restroom before checking your BP (a full bladder can raise your pressure) Relax in a comfortable upright chair Feet on the ground Left arm resting comfortably on a flat surface at the level of your heart Legs uncrossed Back supported Sit quietly and don't talk Place the cuff on your bare arm Adjust snuggly, so that only two fingertips can fit between your skin and the top of the cuff Check 2 readings separated by at least one  minute Keep a log of your BP readings For a visual, please reference this diagram: http://ccnc.care/bpdiagram  Provider Name: [facility]     Phone: [phone]  Zone 1: ALL CLEAR  Continue to monitor your symptoms:  BP reading is less than 140 (top number) or less than 90 (bottom number)  No right upper stomach pain No headaches or seeing spots No feeling nauseated or throwing up No swelling in face and hands  Zone 2: CAUTION Call your doctor's office for any of the following:  BP reading is greater than 140 (top number) or greater than 90 (bottom number)  Stomach pain under your ribs in the middle or right side Headaches or seeing spots Feeling nauseated or throwing up Swelling in face and hands  Zone 3: EMERGENCY  Seek immediate medical care if you have any of the following:  BP reading is greater than160 (top number) or greater than 110 (bottom number) Severe headaches not improving with Tylenol  Serious difficulty catching your breath Any worsening symptoms from Zone 2   Second Trimester of Pregnancy The second trimester is from week 13 through week 28, months 4 through 6. The second trimester is often a time when you feel your best. Your body has also adjusted to being pregnant, and you begin to feel better physically. Usually, morning sickness has lessened or quit completely, you may have more energy, and you may have an increase in appetite. The second trimester is also a time when the fetus is growing rapidly. At the end of the sixth month, the fetus is about 9 inches long and weighs about 1 pounds. You will likely begin to feel the baby move (quickening) between 18 and 20 weeks of the pregnancy. BODY CHANGES Your body goes through many changes during pregnancy. The changes vary from woman to woman.  Your weight will continue to increase. You will notice your lower abdomen bulging out. You may begin to get stretch marks on your hips, abdomen, and breasts. You may  develop headaches that can be relieved by medicines approved by your health care provider. You may urinate more often because the fetus is pressing on your bladder. You may develop or continue to have heartburn as a result of your pregnancy. You may develop constipation because certain hormones are causing the muscles that push waste through your intestines to slow down. You may develop hemorrhoids or swollen, bulging veins (varicose veins). You may have back pain because of the weight gain and pregnancy hormones relaxing your joints between the bones in your pelvis and as a result of a shift in weight and the muscles that support your balance. Your breasts will continue to grow and be tender. Your gums may bleed and may be sensitive to brushing and flossing. Dark spots or blotches (chloasma, mask of pregnancy) may develop on your face. This will likely fade after the baby is born. A dark line from your belly button to the pubic area (linea nigra) may appear. This will likely fade after the  baby is born. You may have changes in your hair. These can include thickening of your hair, rapid growth, and changes in texture. Some women also have hair loss during or after pregnancy, or hair that feels dry or thin. Your hair will most likely return to normal after your baby is born. WHAT TO EXPECT AT YOUR PRENATAL VISITS During a routine prenatal visit: You will be weighed to make sure you and the fetus are growing normally. Your blood pressure will be taken. Your abdomen will be measured to track your baby's growth. The fetal heartbeat will be listened to. Any test results from the previous visit will be discussed. Your health care provider may ask you: How you are feeling. If you are feeling the baby move. If you have had any abnormal symptoms, such as leaking fluid, bleeding, severe headaches, or abdominal cramping. If you have any questions. Other tests that may be performed during your second  trimester include: Blood tests that check for: Low iron levels (anemia). Gestational diabetes (between 24 and 28 weeks). Rh antibodies. Urine tests to check for infections, diabetes, or protein in the urine. An ultrasound to confirm the proper growth and development of the baby. An amniocentesis to check for possible genetic problems. Fetal screens for spina bifida and Down syndrome. HOME CARE INSTRUCTIONS  Avoid all smoking, herbs, alcohol, and unprescribed drugs. These chemicals affect the formation and growth of the baby. Follow your health care provider's instructions regarding medicine use. There are medicines that are either safe or unsafe to take during pregnancy. Exercise only as directed by your health care provider. Experiencing uterine cramps is a good sign to stop exercising. Continue to eat regular, healthy meals. Wear a good support bra for breast tenderness. Do not use hot tubs, steam rooms, or saunas. Wear your seat belt at all times when driving. Avoid raw meat, uncooked cheese, cat litter boxes, and soil used by cats. These carry germs that can cause birth defects in the baby. Take your prenatal vitamins. Try taking a stool softener (if your health care provider approves) if you develop constipation. Eat more high-fiber foods, such as fresh vegetables or fruit and whole grains. Drink plenty of fluids to keep your urine clear or pale yellow. Take warm sitz baths to soothe any pain or discomfort caused by hemorrhoids. Use hemorrhoid cream if your health care provider approves. If you develop varicose veins, wear support hose. Elevate your feet for 15 minutes, 3-4 times a day. Limit salt in your diet. Avoid heavy lifting, wear low heel shoes, and practice good posture. Rest with your legs elevated if you have leg cramps or low back pain. Visit your dentist if you have not gone yet during your pregnancy. Use a soft toothbrush to brush your teeth and be gentle when you floss. A  sexual relationship may be continued unless your health care provider directs you otherwise. Continue to go to all your prenatal visits as directed by your health care provider. SEEK MEDICAL CARE IF:  You have dizziness. You have mild pelvic cramps, pelvic pressure, or nagging pain in the abdominal area. You have persistent nausea, vomiting, or diarrhea. You have a bad smelling vaginal discharge. You have pain with urination. SEEK IMMEDIATE MEDICAL CARE IF:  You have a fever. You are leaking fluid from your vagina. You have spotting or bleeding from your vagina. You have severe abdominal cramping or pain. You have rapid weight gain or loss. You have shortness of breath with chest pain. You  notice sudden or extreme swelling of your face, hands, ankles, feet, or legs. You have not felt your baby move in over an hour. You have severe headaches that do not go away with medicine. You have vision changes. Document Released: 01/02/2001 Document Revised: 01/13/2013 Document Reviewed: 03/11/2012 [facility] Patient Information 2015 August, [place]. This information is not intended to replace advice given to you by your health care provider. Make sure you discuss any questions you have with your health care provider.

## 2023-02-08 ENCOUNTER — Ambulatory Visit: Payer: MEDICAID | Admitting: Cardiology

## 2023-03-05 ENCOUNTER — Ambulatory Visit: Payer: MEDICAID | Admitting: Women's Health

## 2023-03-05 ENCOUNTER — Other Ambulatory Visit: Payer: MEDICAID

## 2023-03-05 ENCOUNTER — Encounter: Payer: Self-pay | Admitting: Women's Health

## 2023-03-05 VITALS — BP 114/76 | HR 120 | Wt 214.0 lb

## 2023-03-05 DIAGNOSIS — Z3480 Encounter for supervision of other normal pregnancy, unspecified trimester: Secondary | ICD-10-CM

## 2023-03-05 DIAGNOSIS — Z3483 Encounter for supervision of other normal pregnancy, third trimester: Secondary | ICD-10-CM | POA: Diagnosis not present

## 2023-03-05 DIAGNOSIS — Z3A28 28 weeks gestation of pregnancy: Secondary | ICD-10-CM | POA: Diagnosis not present

## 2023-03-05 DIAGNOSIS — Z3A27 27 weeks gestation of pregnancy: Secondary | ICD-10-CM

## 2023-03-05 DIAGNOSIS — Z131 Encounter for screening for diabetes mellitus: Secondary | ICD-10-CM

## 2023-03-05 NOTE — Patient Instructions (Signed)
Amy Barrera, thank you for choosing our office today! We appreciate the opportunity to meet your healthcare needs. You may receive a short survey by mail, e-mail, or through [employer]. If you are happy with your care we would appreciate if you could take just a few minutes to complete the survey questions. We read all of your comments and take your feedback very seriously. Thank you again for choosing our office.  Center for [employer] Team at [facility]  [facility] at [facility] ([address] [place], [place] [address]) Entrance C, located off of [address] Free 24/7 valet parking   CLASSES: Go to [email or web address] to register for classes (childbirth, breastfeeding, waterbirth, infant CPR, daddy bootcamp, etc.)  Call the office [phone]) or go to [facility] if: You begin to have strong, frequent contractions Your water breaks.  Sometimes it is a big gush of fluid, sometimes it is just a trickle that keeps getting your panties wet or running down your legs You have vaginal bleeding.  It is normal to have a small amount of spotting if your cervix was checked.  You don't feel your baby moving like normal.  If you don't, get you something to eat and drink and lay down and focus on feeling your baby move.   If your baby is still not moving like normal, you should call the office or go to [facility].  Call the office [phone]) or go to [facility] for these signs of pre-eclampsia: Severe headache that does not go away with Tylenol Visual changes- seeing spots, double, blurred vision Pain under your right breast or upper abdomen that does not go away with Tums or heartburn medicine Nausea and/or vomiting Severe swelling in your hands, feet, and face   Tdap Vaccine It is recommended that you get the Tdap vaccine during the third trimester of EACH pregnancy to help protect your baby from getting pertussis (whooping cough) 27-36 weeks is the BEST time to do  this so that you can pass the protection on to your baby. During pregnancy is better than after pregnancy, but if you are unable to get it during pregnancy it will be offered at the hospital.  You can get this vaccine with [place], at the health department, your family doctor, or some local pharmacies Everyone who will be around your baby should also be up-to-date on their vaccines before the baby comes. Adults (who are not pregnant) only need 1 dose of Tdap during adulthood.   [facility] Pediatricians/Family Doctors [facility]): [address] Colette Ribas, [phone]           [facility]: [address], [phone]                [facility] Medicine [facility]): [address], [phone] (call to ask if accepting patients) [facility] Department: [address], [place], [phone]    [facility] Pediatricians/Family Doctors [facility]): [phone], [phone] [facility]: [address] [place], [phone] [facility]: [address], [phone]  [facility] Doctors  [facility]): [phone] [facility]: [address] [phone]   [name] Doctors [facility]: [address], [phone]  [facility] Family Doctors  [facility]: [phone], [phone]  Home Blood Pressure Monitoring for Patients   Your provider has recommended that you check your  blood pressure (BP) at least once a week at home. If you do not have a blood pressure cuff at home, one will be provided for you. Contact your provider if you have not received your monitor within 1 week.   Helpful Tips for Accurate Home Blood Pressure Checks  Don't smoke, exercise, or drink caffeine 30 minutes before checking your BP Use the restroom before checking your BP (a full bladder can raise your  pressure) Relax in a comfortable upright chair Feet on the ground Left arm resting comfortably on a flat surface at the level of your heart Legs uncrossed Back supported Sit quietly and don't talk Place the cuff on your bare arm Adjust snuggly, so that only two fingertips can fit between your skin and the top of the cuff Check 2 readings separated by at least one minute Keep a log of your BP readings For a visual, please reference this diagram: http://ccnc.care/bpdiagram  Provider Name: [facility]     Phone: [phone]  Zone 1: ALL CLEAR  Continue to monitor your symptoms:  BP reading is less than 140 (top number) or less than 90 (bottom number)  No right upper stomach pain No headaches or seeing spots No feeling nauseated or throwing up No swelling in face and hands  Zone 2: CAUTION Call your doctor's office for any of the following:  BP reading is greater than 140 (top number) or greater than 90 (bottom number)  Stomach pain under your ribs in the middle or right side Headaches or seeing spots Feeling nauseated or throwing up Swelling in face and hands  Zone 3: EMERGENCY  Seek immediate medical care if you have any of the following:  BP reading is greater than160 (top number) or greater than 110 (bottom number) Severe headaches not improving with Tylenol Serious difficulty catching your breath Any worsening symptoms from Zone 2   Third Trimester of Pregnancy The third trimester is from week 29 through week 42, months 7 through 9. The third trimester is a time when the fetus is growing rapidly. At the end of the ninth month, the fetus is about 20 inches in length and weighs 6-10 pounds.  BODY CHANGES Your body goes through many changes during pregnancy. The changes vary from woman to woman.  Your weight will continue to increase. You can expect to gain 25-35 pounds (11-16 kg) by the end of the pregnancy. You may begin to get stretch marks on your hips, abdomen,  and breasts. You may urinate more often because the fetus is moving lower into your pelvis and pressing on your bladder. You may develop or continue to have heartburn as a result of your pregnancy. You may develop constipation because certain hormones are causing the muscles that push waste through your intestines to slow down. You may develop hemorrhoids or swollen, bulging veins (varicose veins). You may have pelvic pain because of the weight gain and pregnancy hormones relaxing your joints between the bones in your pelvis. Backaches may result from overexertion of the muscles supporting your posture. You may have changes in your hair. These can include thickening of your hair, rapid growth, and changes in texture. Some women also have hair loss during or after pregnancy, or hair that feels dry or thin. Your hair will most likely return to normal after your baby is born. Your breasts will continue to grow and be tender. A yellow discharge may leak from your breasts called colostrum. Your belly button may stick out. You may  feel short of breath because of your expanding uterus. You may notice the fetus "dropping," or moving lower in your abdomen. You may have a bloody mucus discharge. This usually occurs a few days to a week before labor begins. Your cervix becomes thin and soft (effaced) near your due date. WHAT TO EXPECT AT YOUR PRENATAL EXAMS  You will have prenatal exams every 2 weeks until week 36. Then, you will have weekly prenatal exams. During a routine prenatal visit: You will be weighed to make sure you and the fetus are growing normally. Your blood pressure is taken. Your abdomen will be measured to track your baby's growth. The fetal heartbeat will be listened to. Any test results from the previous visit will be discussed. You may have a cervical check near your due date to see if you have effaced. At around 36 weeks, your caregiver will check your cervix. At the same time, your  caregiver will also perform a test on the secretions of the vaginal tissue. This test is to determine if a type of bacteria, Group B streptococcus, is present. Your caregiver will explain this further. Your caregiver may ask you: What your birth plan is. How you are feeling. If you are feeling the baby move. If you have had any abnormal symptoms, such as leaking fluid, bleeding, severe headaches, or abdominal cramping. If you have any questions. Other tests or screenings that may be performed during your third trimester include: Blood tests that check for low iron levels (anemia). Fetal testing to check the health, activity level, and growth of the fetus. Testing is done if you have certain medical conditions or if there are problems during the pregnancy. FALSE LABOR You may feel small, irregular contractions that eventually go away. These are called Braxton Hicks contractions, or false labor. Contractions may last for hours, days, or even weeks before true labor sets in. If contractions come at regular intervals, intensify, or become painful, it is best to be seen by your caregiver.  SIGNS OF LABOR  Menstrual-like cramps. Contractions that are 5 minutes apart or less. Contractions that start on the top of the uterus and spread down to the lower abdomen and back. A sense of increased pelvic pressure or back pain. A watery or bloody mucus discharge that comes from the vagina. If you have any of these signs before the 37th week of pregnancy, call your caregiver right away. You need to go to the hospital to get checked immediately. HOME CARE INSTRUCTIONS  Avoid all smoking, herbs, alcohol, and unprescribed drugs. These chemicals affect the formation and growth of the baby. Follow your caregiver's instructions regarding medicine use. There are medicines that are either safe or unsafe to take during pregnancy. Exercise only as directed by your caregiver. Experiencing uterine cramps is a good sign to  stop exercising. Continue to eat regular, healthy meals. Wear a good support bra for breast tenderness. Do not use hot tubs, steam rooms, or saunas. Wear your seat belt at all times when driving. Avoid raw meat, uncooked cheese, cat litter boxes, and soil used by cats. These carry germs that can cause birth defects in the baby. Take your prenatal vitamins. Try taking a stool softener (if your caregiver approves) if you develop constipation. Eat more high-fiber foods, such as fresh vegetables or fruit and whole grains. Drink plenty of fluids to keep your urine clear or pale yellow. Take warm sitz baths to soothe any pain or discomfort caused by hemorrhoids. Use hemorrhoid cream if  your caregiver approves. If you develop varicose veins, wear support hose. Elevate your feet for 15 minutes, 3-4 times a day. Limit salt in your diet. Avoid heavy lifting, wear low heal shoes, and practice good posture. Rest a lot with your legs elevated if you have leg cramps or low back pain. Visit your dentist if you have not gone during your pregnancy. Use a soft toothbrush to brush your teeth and be gentle when you floss. A sexual relationship may be continued unless your caregiver directs you otherwise. Do not travel far distances unless it is absolutely necessary and only with the approval of your caregiver. Take prenatal classes to understand, practice, and ask questions about the labor and delivery. Make a trial run to the hospital. Pack your hospital bag. Prepare the baby's nursery. Continue to go to all your prenatal visits as directed by your caregiver. SEEK MEDICAL CARE IF: You are unsure if you are in labor or if your water has broken. You have dizziness. You have mild pelvic cramps, pelvic pressure, or nagging pain in your abdominal area. You have persistent nausea, vomiting, or diarrhea. You have a bad smelling vaginal discharge. You have pain with urination. SEEK IMMEDIATE MEDICAL CARE IF:  You  have a fever. You are leaking fluid from your vagina. You have spotting or bleeding from your vagina. You have severe abdominal cramping or pain. You have rapid weight loss or gain. You have shortness of breath with chest pain. You notice sudden or extreme swelling of your face, hands, ankles, feet, or legs. You have not felt your baby move in over an hour. You have severe headaches that do not go away with medicine. You have vision changes. Document Released: 01/02/2001 Document Revised: 01/13/2013 Document Reviewed: 03/11/2012 [facility] Patient Information 2015 [place], [place]. This information is not intended to replace advice given to you by your health care provider. Make sure you discuss any questions you have with your health care provider.

## 2023-03-05 NOTE — Progress Notes (Signed)
LOW-RISK PREGNANCY VISIT Patient name: Amy Barrera MRN [id number]  Date of birth: 2000-03-03 Chief Complaint:   Routine Prenatal Visit (PN2)  History of Present Illness:   Amy Barrera is a 23 y.o. G83P1001 female at [redacted]w[redacted]d with an Estimated Date of Delivery: 05/28/23 being seen today for ongoing management of a low-risk pregnancy.   Today she reports occasional contractions.  .  .  Movement: Present. denies leaking of fluid.     03/05/2023    9:03 AM 12/05/2022    9:46 AM 04/27/2021    9:37 AM 01/12/2021    3:02 PM  Depression screen PHQ 2/9  Decreased Interest 0 0 0 0  Down, Depressed, Hopeless 0 0 0 0  PHQ - 2 Score 0 0 0 0  Altered sleeping 2 0 0 0  Tired, decreased energy 1 0 0 0  Change in appetite 0 0 0 0  Feeling bad or failure about yourself  2 0 0 0  Trouble concentrating  0 0 0  Moving slowly or fidgety/restless 0 0 0 0  Suicidal thoughts 0 0 0 0  PHQ-9 Score 5 0 0 0  Difficult doing work/chores    Not difficult at all        12/05/2022    9:46 AM 04/27/2021    9:37 AM 01/12/2021    3:02 PM  GAD 7 : Generalized Anxiety Score  Nervous, Anxious, on Edge 0 0 0  Control/stop worrying 0 0 0  Worry too much - different things 0 0 0  Trouble relaxing 0 0 0  Restless 0 0 0  Easily annoyed or irritable 0 0 0  Afraid - awful might happen 0 0 0  Total GAD 7 Score 0 0 0  Anxiety Difficulty   Not difficult at all      Review of Systems:   Pertinent items are noted in HPI Denies abnormal vaginal discharge w/ itching/odor/irritation, headaches, visual changes, shortness of breath, chest pain, abdominal pain, severe nausea/vomiting, or problems with urination or bowel movements unless otherwise stated above. Pertinent History Reviewed:  Reviewed past medical,surgical, social, obstetrical and family history.  Reviewed problem list, medications and allergies. Physical Assessment:   Vitals:   03/05/23 0906  BP: 114/76  Pulse: (!) 120  Weight: 214 lb (97.1 kg)   Body mass index is 32.54 kg/m.        Physical Examination:   General appearance: Well appearing, and in no distress  Mental status: Alert, oriented to person, place, and time  Skin: Warm & dry  Cardiovascular: Normal heart rate noted  Respiratory: Normal respiratory effort, no distress  Abdomen: Soft, gravid, nontender  Pelvic: Cervical exam deferred         Extremities: Edema: None  Fetal Status: Fetal Heart Rate (bpm): 145 Fundal Height: 29 cm Movement: Present    Chaperone: N/A   No results found for this or any previous visit (from the past 24 hours).  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  Labs/procedures today: PN2 and declines tdap today, maybe next visit  Plan:  Continue routine obstetrical care  Next visit: prefers in person  Reviewed: Preterm labor symptoms and general obstetric precautions including but not limited to vaginal bleeding, contractions, leaking of fluid and fetal movement were reviewed in detail with the patient.  All questions were answered. Does have home bp cuff. Office bp cuff given: not applicable. Check bp weekly, let us know if consistently >140 and/or >90.  Follow-up: Return in about 2 weeks (around 03/19/2023) for LROB, CNM, in person.  Future Appointments  Date Time Provider Department Center  03/20/2023  9:00 AM Tobb, Lavona Mound, DO [facility] None    No orders of the defined types were placed in this encounter.  Cheral Marker CNM, [facility] 03/05/2023 9:28 AM

## 2023-03-06 ENCOUNTER — Encounter: Payer: Self-pay | Admitting: Women's Health

## 2023-03-06 LAB — CBC
Hematocrit: 37.8 % (ref 34.0–46.6)
Hemoglobin: 11.9 g/dL (ref 11.1–15.9)
MCH: 24.3 pg — ABNORMAL LOW (ref 26.6–33.0)
MCHC: 31.5 g/dL (ref 31.5–35.7)
MCV: 77 fL — ABNORMAL LOW (ref 79–97)
Platelets: 392 10*3/uL (ref 150–450)
RBC: 4.9 x10E6/uL (ref 3.77–5.28)
RDW: 13.4 % (ref 11.7–15.4)
WBC: 10.4 10*3/uL (ref 3.4–10.8)

## 2023-03-06 LAB — GLUCOSE TOLERANCE, 2 HOURS W/ 1HR
Glucose, 1 hour: 92 mg/dL (ref 70–179)
Glucose, 2 hour: 83 mg/dL (ref 70–152)
Glucose, Fasting: 75 mg/dL (ref 70–91)

## 2023-03-06 LAB — HIV ANTIBODY (ROUTINE TESTING W REFLEX): HIV Screen 4th Generation wRfx: NONREACTIVE

## 2023-03-06 LAB — ANTIBODY SCREEN: Antibody Screen: NEGATIVE

## 2023-03-06 LAB — RPR: RPR Ser Ql: NONREACTIVE

## 2023-03-13 NOTE — BH Specialist Note (Deleted)
 Integrated Behavioral Health via Telemedicine Visit  03/13/2023 AARTHI UYENO [id number]  Number of Integrated Behavioral Health Clinician visits: No data recorded Session Start time: No data recorded  Session End time: No data recorded Total time in minutes: No data recorded  Referring Provider: *** Patient/Family location: *** [facility] Provider location: *** All persons participating in visit: *** Types of Service: {CHL AMB TYPE OF SERVICE:[phone]}  I connected with [name] and/or Earnest Conroy Youkhana's {family members:20773} via  Telephone or Video Enabled Telemedicine Application  (Video is Caregility application) and verified that I am speaking with the correct person using two identifiers. Discussed confidentiality: {YES/NO:21197}  I discussed the limitations of telemedicine and the availability of in person appointments.  Discussed there is a possibility of technology failure and discussed alternative modes of communication if that failure occurs.  I discussed that engaging in this telemedicine visit, they consent to the provision of behavioral healthcare and the services will be billed under their insurance.  Patient and/or legal guardian expressed understanding and consented to Telemedicine visit: {YES/NO:21197}  Presenting Concerns: Patient and/or family reports the following symptoms/concerns: *** Duration of problem: ***; Severity of problem: {Mild/Moderate/Severe:20260}  Patient and/or Family's Strengths/Protective Factors: {CHL AMB BH PROTECTIVE FACTORS:[phone]}  Goals Addressed: Patient will:  Reduce symptoms of: {IBH Symptoms:21014056}   Increase knowledge and/or ability of: {IBH Patient Tools:21014057}   Demonstrate ability to: {IBH Goals:21014053}  Progress towards Goals: {CHL AMB BH PROGRESS TOWARDS GOALS:[phone]}  Interventions: Interventions utilized:  {IBH Interventions:21014054} Standardized Assessments completed: {IBH Screening  Tools:21014051}  Patient and/or Family Response: ***  Assessment: Patient currently experiencing ***.   Patient may benefit from ***.  Plan: Follow up with behavioral health clinician on : *** Behavioral recommendations: *** Referral(s): {IBH Referrals:21014055}  I discussed the assessment and treatment plan with the patient and/or parent/guardian. They were provided an opportunity to ask questions and all were answered. They agreed with the plan and demonstrated an understanding of the instructions.   They were advised to call back or seek an in-person evaluation if the symptoms worsen or if the condition fails to improve as anticipated.  Valetta Close Leor Whyte, LCSW

## 2023-03-19 ENCOUNTER — Ambulatory Visit: Payer: MEDICAID | Admitting: Women's Health

## 2023-03-19 ENCOUNTER — Encounter: Payer: Self-pay | Admitting: Women's Health

## 2023-03-19 ENCOUNTER — Other Ambulatory Visit (HOSPITAL_COMMUNITY)
Admission: RE | Admit: 2023-03-19 | Discharge: 2023-03-19 | Disposition: A | Discharge status: Home / Self Care | Payer: MEDICAID | Source: Ambulatory Visit | Attending: Women's Health | Admitting: Women's Health

## 2023-03-19 VITALS — BP 122/75 | HR 120 | Wt 216.8 lb

## 2023-03-19 DIAGNOSIS — O26893 Other specified pregnancy related conditions, third trimester: Secondary | ICD-10-CM | POA: Diagnosis not present

## 2023-03-19 DIAGNOSIS — Z3483 Encounter for supervision of other normal pregnancy, third trimester: Secondary | ICD-10-CM | POA: Insufficient documentation

## 2023-03-19 DIAGNOSIS — O479 False labor, unspecified: Secondary | ICD-10-CM | POA: Insufficient documentation

## 2023-03-19 DIAGNOSIS — Z3A30 30 weeks gestation of pregnancy: Secondary | ICD-10-CM | POA: Diagnosis not present

## 2023-03-19 DIAGNOSIS — Z23 Encounter for immunization: Secondary | ICD-10-CM

## 2023-03-19 DIAGNOSIS — R102 Pelvic and perineal pain: Secondary | ICD-10-CM | POA: Insufficient documentation

## 2023-03-19 DIAGNOSIS — Z3480 Encounter for supervision of other normal pregnancy, unspecified trimester: Secondary | ICD-10-CM

## 2023-03-19 DIAGNOSIS — O26899 Other specified pregnancy related conditions, unspecified trimester: Secondary | ICD-10-CM

## 2023-03-19 NOTE — Patient Instructions (Signed)
Amy Barrera, thank you for choosing our office today! We appreciate the opportunity to meet your healthcare needs. You may receive a short survey by mail, e-mail, or through MyChart. If you are happy with your care we would appreciate if you could take just a few minutes to complete the survey questions. We read all of your comments and take your feedback very seriously. Thank you again for choosing our office.  [facility]  [facility] ([address] [place], [place] [address]) Entrance C, located off of [address] Free 24/7 valet parking   CLASSES: Go to [email or web address] to register for classes (childbirth, breastfeeding, waterbirth, infant CPR, daddy bootcamp, etc.)  Call the office ([phone]) or go to [facility] if: You begin to have strong, frequent contractions Your water breaks.  Sometimes it is a big gush of fluid, sometimes it is just a trickle that keeps getting your panties wet or running down your legs You have vaginal bleeding.  It is normal to have a small amount of spotting if your cervix was checked.  You don't feel your baby moving like normal.  If you don't, get you something to eat and drink and lay down and focus on feeling your baby move.   If your baby is still not moving like normal, you should call the office or go to [facility].  Call the office ([phone]) or go to [facility] for these signs of pre-eclampsia: Severe headache that does not go away with Tylenol Visual changes- seeing spots, double, blurred vision Pain under your right breast or upper abdomen that does not go away with Tums or heartburn medicine Nausea and/or vomiting Severe swelling in your hands, feet, and face   Tdap Vaccine It is recommended that you get the Tdap vaccine during the third trimester of EACH pregnancy to help protect your baby from getting pertussis (whooping cough) 27-36 weeks is the BEST time to do  this so that you can pass the protection on to your baby. During pregnancy is better than after pregnancy, but if you are unable to get it during pregnancy it will be offered at the hospital.  You can get this vaccine with us, at the health department, your family doctor, or some local pharmacies Everyone who will be around your baby should also be up-to-date on their vaccines before the baby comes. Adults (who are not pregnant) only need 1 dose of Tdap during adulthood.   [place] Pediatricians/Family Doctors [facility] ([facility]): [address], [phone]           [facility]: [address], [phone]                [facility] ([facility]): [address], [phone] (call to ask if accepting patients) [facility]: [address], [place], [phone]    [place] Pediatricians/Family Doctors [facility] ([facility]): [address], [phone] [facility]: [address], [phone] [facility] [place]: [address], [phone]  [place] [facility]  [facility] ([facility]): [phone] [facility]: [address], [phone]   [place] Family Doctors [facility]: [address], [phone]  [place] [facility]  [facility]: [address], [phone]  Home Blood Pressure Monitoring for Patients   Your provider has recommended that you check your   blood pressure (BP) at least once a week at home. If you do not have a blood pressure cuff at home, one will be provided for you. Contact your provider if you have not received your monitor within 1 week.   Helpful Tips for Accurate Home Blood Pressure Checks  Don't smoke, exercise, or drink caffeine 30 minutes before checking your BP Use the restroom before checking your BP (a full bladder can raise your  pressure) Relax in a comfortable upright chair Feet on the ground Left arm resting comfortably on a flat surface at the level of your heart Legs uncrossed Back supported Sit quietly and don't talk Place the cuff on your bare arm Adjust snuggly, so that only two fingertips can fit between your skin and the top of the cuff Check 2 readings separated by at least one minute Keep a log of your BP readings For a visual, please reference this diagram: http://ccnc.care/bpdiagram  Provider Name: [facility]     Phone: [phone]  Zone 1: ALL CLEAR  Continue to monitor your symptoms:  BP reading is less than 140 (top number) or less than 90 (bottom number)  No right upper stomach pain No headaches or seeing spots No feeling nauseated or throwing up No swelling in face and hands  Zone 2: CAUTION Call your doctor's office for any of the following:  BP reading is greater than 140 (top number) or greater than 90 (bottom number)  Stomach pain under your ribs in the middle or right side Headaches or seeing spots Feeling nauseated or throwing up Swelling in face and hands  Zone 3: EMERGENCY  Seek immediate medical care if you have any of the following:  BP reading is greater than160 (top number) or greater than 110 (bottom number) Severe headaches not improving with Tylenol Serious difficulty catching your breath Any worsening symptoms from Zone 2  Preterm Labor and Birth Information  The normal length of a pregnancy is 39-41 weeks. Preterm labor is when labor starts before 37 completed weeks of pregnancy. What are the risk factors for preterm labor? Preterm labor is more likely to occur in women who: Have certain infections during pregnancy such as a bladder infection, sexually transmitted infection, or infection inside the uterus (chorioamnionitis). Have a shorter-than-normal cervix. Have gone into preterm labor before. Have had surgery on their cervix. Are younger than age 17  or older than age 35. Are African American. Are pregnant with twins or multiple babies (multiple gestation). Take street drugs or smoke while pregnant. Do not gain enough weight while pregnant. Became pregnant shortly after having been pregnant. What are the symptoms of preterm labor? Symptoms of preterm labor include: Cramps similar to those that can happen during a menstrual period. The cramps may happen with diarrhea. Pain in the abdomen or lower back. Regular uterine contractions that may feel like tightening of the abdomen. A feeling of increased pressure in the pelvis. Increased watery or bloody mucus discharge from the vagina. Water breaking (ruptured amniotic sac). Why is it important to recognize signs of preterm labor? It is important to recognize signs of preterm labor because babies who are born prematurely may not be fully developed. This can put them at an increased risk for: Long-term (chronic) heart and lung problems. Difficulty immediately after birth with regulating body systems, including blood sugar, body temperature, heart rate, and breathing rate. Bleeding in the brain. Cerebral palsy. Learning difficulties. Death. These risks are highest for babies who are born before 34 weeks   of pregnancy. How is preterm labor treated? Treatment depends on the length of your pregnancy, your condition, and the health of your baby. It may involve: Having a stitch (suture) placed in your cervix to prevent your cervix from opening too early (cerclage). Taking or being given medicines, such as: Hormone medicines. These may be given early in pregnancy to help support the pregnancy. Medicine to stop contractions. Medicines to help mature the baby's lungs. These may be prescribed if the risk of delivery is high. Medicines to prevent your baby from developing cerebral palsy. If the labor happens before 34 weeks of pregnancy, you may need to stay in the hospital. What should I do if I  think I am in preterm labor? If you think that you are going into preterm labor, call your health care provider right away. How can I prevent preterm labor in future pregnancies? To increase your chance of having a full-term pregnancy: Do not use any tobacco products, such as cigarettes, chewing tobacco, and e-cigarettes. If you need help quitting, ask your health care provider. Do not use street drugs or medicines that have not been prescribed to you during your pregnancy. Talk with your health care provider before taking any herbal supplements, even if you have been taking them regularly. Make sure you gain a healthy amount of weight during your pregnancy. Watch for infection. If you think that you might have an infection, get it checked right away. Make sure to tell your health care provider if you have gone into preterm labor before. This information is not intended to replace advice given to you by your health care provider. Make sure you discuss any questions you have with your health care provider. Document Revised: 05/02/2018 Document Reviewed: 06/01/2015 Elsevier Patient Education  2020 Elsevier Inc.   

## 2023-03-19 NOTE — Progress Notes (Signed)
 LOW-RISK PREGNANCY VISIT Patient name: Amy Barrera MRN [id number]  Date of birth: 01-20-2001 Chief Complaint:   Routine Prenatal Visit (Increase in vaginal pressure/)  History of Present Illness:   Amy Barrera is a 23 y.o. G53P1001 female at [redacted]w[redacted]d with an Estimated Date of Delivery: 05/28/23 being seen today for ongoing management of a low-risk pregnancy.   Today she reports  contractions, pressure, mucousy discharge, spotting the other day. . Denies abnormal discharge, itching/odor/irritation. No UTI sx.   Contractions: Irritability. Vag. Bleeding: None.  Movement: Present. denies leaking of fluid.     03/05/2023    9:03 AM 12/05/2022    9:46 AM 04/27/2021    9:37 AM 01/12/2021    3:02 PM  Depression screen PHQ 2/9  Decreased Interest 0 0 0 0  Down, Depressed, Hopeless 0 0 0 0  PHQ - 2 Score 0 0 0 0  Altered sleeping 2 0 0 0  Tired, decreased energy 1 0 0 0  Change in appetite 0 0 0 0  Feeling bad or failure about yourself  2 0 0 0  Trouble concentrating  0 0 0  Moving slowly or fidgety/restless 0 0 0 0  Suicidal thoughts 0 0 0 0  PHQ-9 Score 5 0 0 0  Difficult doing work/chores    Not difficult at all        12/05/2022    9:46 AM 04/27/2021    9:37 AM 01/12/2021    3:02 PM  GAD 7 : Generalized Anxiety Score  Nervous, Anxious, on Edge 0 0 0  Control/stop worrying 0 0 0  Worry too much - different things 0 0 0  Trouble relaxing 0 0 0  Restless 0 0 0  Easily annoyed or irritable 0 0 0  Afraid - awful might happen 0 0 0  Total GAD 7 Score 0 0 0  Anxiety Difficulty   Not difficult at all      Review of Systems:   Pertinent items are noted in HPI Denies abnormal vaginal discharge w/ itching/odor/irritation, headaches, visual changes, shortness of breath, chest pain, abdominal pain, severe nausea/vomiting, or problems with urination or bowel movements unless otherwise stated above. Pertinent History Reviewed:  Reviewed past medical,surgical, social, obstetrical and  family history.  Reviewed problem list, medications and allergies. Physical Assessment:   Vitals:   03/19/23 0850  BP: 122/75  Pulse: (!) 120  Weight: 216 lb 12.8 oz (98.3 kg)  Body mass index is 32.96 kg/m.        Physical Examination:   General appearance: Well appearing, and in no distress  Mental status: Alert, oriented to person, place, and time  Skin: Warm & dry  Cardiovascular: Normal heart rate noted  Respiratory: Normal respiratory effort, no distress  Abdomen: Soft, gravid, nontender  Pelvic:  spec exam: cx visually long and closed, some mucousy d/c, CV swab obtained.   Dilation: Closed Effacement (%): Thick Station: Ballotable (outer os 2, inner os closed/thick but soft)  Extremities: Edema: None  Fetal Status: Fetal Heart Rate (bpm): 155 Fundal Height: 30 cm Movement: Present    Chaperone: Latisha Cresenzo   No results found for this or any previous visit (from the past 24 hours).  Assessment & Plan:  1) Low-risk pregnancy G2P1001 at [redacted]w[redacted]d with an Estimated Date of Delivery: 05/28/23   2) Intermittent contractions, pressure, mucousy d/c, recent spotting, CV swab, increase po water intake, reviewed ptl s/s, reasons to seek care.   3) H/O GHTN/PPHTN>  ASA  4) Tachycardia> even prior to pregnancy, has appt w/ OB cards tomorrow   Meds: No orders of the defined types were placed in this encounter.  Labs/procedures today: spec exam, CV swab, and SVE, tdap  Plan:  Continue routine obstetrical care  Next visit: prefers in person    Reviewed: Preterm labor symptoms and general obstetric precautions including but not limited to vaginal bleeding, contractions, leaking of fluid and fetal movement were reviewed in detail with the patient.  All questions were answered. Does have home bp cuff. Office bp cuff given: not applicable. Check bp weekly, let us know if consistently >140 and/or >90.  Follow-up: Return in about 2 weeks (around 04/02/2023) for LROB, CNM.  Future  Appointments  Date Time Provider Department Center  03/20/2023  9:00 AM Thomasene Ripple, DO [facility] None  03/21/2023  2:15 PM [facility] HEALTH CLINICIAN [facility]  04/02/2023  8:50 AM Cheral Marker, CNM [facility]    Orders Placed This Encounter  Procedures   Tdap vaccine greater than or equal to 7yo IM   Cheral Marker CNM, [facility] 03/19/2023 9:23 AM

## 2023-03-20 ENCOUNTER — Ambulatory Visit: Payer: MEDICAID | Attending: Cardiology | Admitting: Cardiology

## 2023-03-20 ENCOUNTER — Encounter: Payer: Self-pay | Admitting: Women's Health

## 2023-03-20 LAB — CERVICOVAGINAL ANCILLARY ONLY
Bacterial Vaginitis (gardnerella): NEGATIVE
Candida Glabrata: NEGATIVE
Candida Vaginitis: NEGATIVE
Chlamydia: NEGATIVE
Comment: NEGATIVE
Comment: NEGATIVE
Comment: NEGATIVE
Comment: NEGATIVE
Comment: NEGATIVE
Comment: NORMAL
Neisseria Gonorrhea: NEGATIVE
Trichomonas: NEGATIVE

## 2023-04-01 ENCOUNTER — Ambulatory Visit: Payer: MEDICAID | Admitting: *Deleted

## 2023-04-01 ENCOUNTER — Encounter: Payer: Self-pay | Admitting: *Deleted

## 2023-04-01 VITALS — BP 112/90 | HR 120 | Wt 211.0 lb

## 2023-04-01 NOTE — Progress Notes (Signed)
   NURSE VISIT- BLOOD PRESSURE CHECK  I connected with Amy Barrera on 04/01/2023 by telephone  and verified that I am speaking with the correct person using two identifiers.   I discussed the limitations of evaluation and management by telemedicine. The patient expressed understanding and agreed to proceed.  Nurse is at the office, and patient is at home.  SUBJECTIVE:  Amy Barrera is a 23 y.o. G16P1001 female here for BP check. She is [redacted]w[redacted]d pregnant    HYPERTENSION ROS:  Pregnant/postpartum:  Severe headaches that don't go away with tylenol/other medicines: No  Visual changes (seeing spots/double/blurred vision) Yes  Severe pain under right breast breast or in center of upper chest Yes  Severe nausea/vomiting No  Taking medicines as instructed no   OBJECTIVE:  BP (!) 112/90   Pulse (!) 120   Wt 211 lb (95.7 kg)   LMP 08/21/2022   BMI 32.08 kg/m   Appearance alert, well appearing, and in no distress.  ASSESSMENT: Pregnancy [redacted]w[redacted]d  blood pressure check  PLAN: Discussed with Amy Barrera, CNM, WHNP. Pt can't come in today for appt. Pt was advised to go to [facility] if developed headache that don't go away with Tylenol, worse visual changes or worsening pain.    Recommendations: no changes needed   Follow-up: as scheduled   Amy Barrera  04/01/2023 2:53 PM

## 2023-04-02 ENCOUNTER — Encounter: Payer: Self-pay | Admitting: Women's Health

## 2023-04-02 ENCOUNTER — Ambulatory Visit: Payer: MEDICAID | Admitting: Women's Health

## 2023-04-02 VITALS — BP 113/78 | HR 84 | Wt 221.0 lb

## 2023-04-02 DIAGNOSIS — R030 Elevated blood-pressure reading, without diagnosis of hypertension: Secondary | ICD-10-CM

## 2023-04-02 DIAGNOSIS — Z3483 Encounter for supervision of other normal pregnancy, third trimester: Secondary | ICD-10-CM

## 2023-04-02 DIAGNOSIS — Z3A32 32 weeks gestation of pregnancy: Secondary | ICD-10-CM | POA: Diagnosis not present

## 2023-04-02 DIAGNOSIS — Z3480 Encounter for supervision of other normal pregnancy, unspecified trimester: Secondary | ICD-10-CM

## 2023-04-02 DIAGNOSIS — Z8759 Personal history of other complications of pregnancy, childbirth and the puerperium: Secondary | ICD-10-CM | POA: Diagnosis not present

## 2023-04-02 DIAGNOSIS — R1011 Right upper quadrant pain: Secondary | ICD-10-CM

## 2023-04-02 MED ORDER — PROMETHAZINE HCL 25 MG PO TABS: 12.5000 mg | ORAL_TABLET | Freq: Four times a day (QID) | ORAL | 1 refills | Status: DC | PRN

## 2023-04-02 NOTE — Progress Notes (Signed)
 LOW-RISK PREGNANCY VISIT Patient name: Amy Barrera MRN [id number]  Date of birth: 12-22-2000 Chief Complaint:   Routine Prenatal Visit  History of Present Illness:   TIFFNEY HAUGHTON is a 23 y.o. G63P1001 female at [redacted]w[redacted]d with an Estimated Date of Delivery: 05/28/23 being seen today for ongoing management of a low-risk pregnancy.   Today she reports  elevated bp at home yesterday, 112/90, 130s/100s. Are normal in am, elevated mid day, then normal (but higher than her baseline) at night . No headaches. Sometimes sees 'glitter', RUQ pain- worse after fried/greasy/fatty foods, some nausea. Contractions: Irregular.  .  Movement: Present. denies leaking of fluid.     03/05/2023    9:03 AM 12/05/2022    9:46 AM 04/27/2021    9:37 AM 01/12/2021    3:02 PM  Depression screen PHQ 2/9  Decreased Interest 0 0 0 0  Down, Depressed, Hopeless 0 0 0 0  PHQ - 2 Score 0 0 0 0  Altered sleeping 2 0 0 0  Tired, decreased energy 1 0 0 0  Change in appetite 0 0 0 0  Feeling bad or failure about yourself  2 0 0 0  Trouble concentrating  0 0 0  Moving slowly or fidgety/restless 0 0 0 0  Suicidal thoughts 0 0 0 0  PHQ-9 Score 5 0 0 0  Difficult doing work/chores    Not difficult at all        12/05/2022    9:46 AM 04/27/2021    9:37 AM 01/12/2021    3:02 PM  GAD 7 : Generalized Anxiety Score  Nervous, Anxious, on Edge 0 0 0  Control/stop worrying 0 0 0  Worry too much - different things 0 0 0  Trouble relaxing 0 0 0  Restless 0 0 0  Easily annoyed or irritable 0 0 0  Afraid - awful might happen 0 0 0  Total GAD 7 Score 0 0 0  Anxiety Difficulty   Not difficult at all      Review of Systems:   Pertinent items are noted in HPI Denies abnormal vaginal discharge w/ itching/odor/irritation, headaches, visual changes, shortness of breath, chest pain, abdominal pain, severe nausea/vomiting, or problems with urination or bowel movements unless otherwise stated above. Pertinent History Reviewed:   Reviewed past medical,surgical, social, obstetrical and family history.  Reviewed problem list, medications and allergies. Physical Assessment:   Vitals:   04/02/23 0844  BP: 113/78  Pulse: 84  Weight: 221 lb (100.2 kg)  Body mass index is 33.6 kg/m.  Her bp cuff- same        Physical Examination:   General appearance: Well appearing, and in no distress  Mental status: Alert, oriented to person, place, and time  Skin: Warm & dry  Cardiovascular: Normal heart rate noted  Respiratory: Normal respiratory effort, no distress  Abdomen: Soft, gravid, nontender  Pelvic: Cervical exam deferred         Extremities: Edema: Trace  Fetal Status: Fetal Heart Rate (bpm): 144 Fundal Height: 34 cm Movement: Present    Chaperone: N/A No results found for this or any previous visit (from the past 24 hours).  Assessment & Plan:  1) Low-risk pregnancy G2P1001 at [redacted]w[redacted]d with an Estimated Date of Delivery: 05/28/23   2) H/O GHTN, w/ elevated home bp yesterday, occ sees 'glitter', RUQ pain (sounds more gb)- normal bp today (same w/ her cuff), will get pre-e labs. To check bp BID, let us know  if >140/90 or pre-e s/s  3) RUQ pain> worse after fried/greasy/fatty/spicy foods, some nausea. Discussed sounds like gallbladder, to cut out these foods, rx phenergan. If not improving/worsening, let me know and will get gb u/s  4) EFW 92% at 19wks> FH 34cm today, discussed repeat EFW u/s at 36w- pt wants    Meds:  Meds ordered this encounter  Medications   promethazine (PHENERGAN) 25 MG tablet    Sig: Take 0.5-1 tablets (12.5-25 mg total) by mouth every 6 (six) hours as needed.    Dispense:  30 tablet    Refill:  1   Labs/procedures today:  pre-e labs  Plan:  Continue routine obstetrical care  Next visit: prefers in person    Reviewed: Preterm labor symptoms and general obstetric precautions including but not limited to vaginal bleeding, contractions, leaking of fluid and fetal movement were reviewed  in detail with the patient.  All questions were answered. Does have home bp cuff. Office bp cuff given: not applicable. Check bp twice daily, let us know if consistently >140 and/or >90.  Follow-up: Return for As scheduled; add EFW to 4/8 visit please.  Future Appointments  Date Time Provider Department Center  04/16/2023  8:50 AM Cheral Marker, CNM [facility]  04/30/2023  8:50 AM Cheral Marker, CNM [facility]  04/30/2023  9:15 AM [facility] 2 [facility] None    Orders Placed This Encounter  Procedures   CBC   Comprehensive metabolic panel   Protein / creatinine ratio, urine   Cheral Marker CNM, [facility] 04/02/2023 9:23 AM

## 2023-04-02 NOTE — Patient Instructions (Addendum)
 Amy Barrera, thank you for choosing our office today! We appreciate the opportunity to meet your healthcare needs. You may receive a short survey by mail, e-mail, or through [employer]. If you are happy with your care we would appreciate if you could take just a few minutes to complete the survey questions. We read all of your comments and take your feedback very seriously. Thank you again for choosing our office.  Center for [employer] Team at [facility]  [facility] at [facility] ([address] [place], [place] [address]) Entrance C, located off of [address] Free 24/7 valet parking   CLASSES: Go to [email or web address] to register for classes (childbirth, breastfeeding, waterbirth, infant CPR, daddy bootcamp, etc.)  Call the office [phone]) or go to [facility] if: You begin to have strong, frequent contractions Your water breaks.  Sometimes it is a big gush of fluid, sometimes it is just a trickle that keeps getting your panties wet or running down your legs You have vaginal bleeding.  It is normal to have a small amount of spotting if your cervix was checked.  You don't feel your baby moving like normal.  If you don't, get you something to eat and drink and lay down and focus on feeling your baby move.   If your baby is still not moving like normal, you should call the office or go to [facility].  Call the office [phone]) or go to [facility] hospital for these signs of pre-eclampsia: Severe headache that does not go away with Tylenol Visual changes- seeing spots, double, blurred vision Pain under your right breast or upper abdomen that does not go away with Tums or heartburn medicine Nausea and/or vomiting Severe swelling in your hands, feet, and face   Tdap Vaccine It is recommended that you get the Tdap vaccine during the third trimester of EACH pregnancy to help protect your baby from getting pertussis (whooping cough) 27-36 weeks is the BEST time to do  this so that you can pass the protection on to your baby. During pregnancy is better than after pregnancy, but if you are unable to get it during pregnancy it will be offered at the hospital.  You can get this vaccine with [place], at the health department, your family doctor, or some local pharmacies Everyone who will be around your baby should also be up-to-date on their vaccines before the baby comes. Adults (who are not pregnant) only need 1 dose of Tdap during adulthood.   [facility] Pediatricians/Family Doctors [facility]): [address] Colette Ribas, [phone]           [facility]: [address], [phone]                [facility]): [address], [phone] (call to ask if accepting patients) [facility] Department: [address], [place], [phone]    [facility] Pediatricians/Family Doctors [facility]): [phone], [phone] [facility]: [address] [place], [phone] [facility]: [address], [phone]  [facility] Doctors  [facility]): [phone] [facility]: [address] [phone]   [facility] Doctors [facility]: [address] [phone]  [facility] Family Doctors  [facility]: [phone], [phone]  Home Blood Pressure Monitoring for Patients   Your provider has recommended that you check your  blood pressure (BP) at least once a week at home. If you do not have a blood pressure cuff at home, one will be provided for you. Contact your provider if you have not received your monitor within 1 week.   Helpful Tips for Accurate Home Blood Pressure Checks  Don't smoke, exercise, or drink caffeine 30 minutes before checking your BP Use the restroom before checking your BP (a full bladder can raise your  pressure) Relax in a comfortable upright chair Feet on the ground Left arm resting comfortably on a flat surface at the level of your heart Legs uncrossed Back supported Sit quietly and don't talk Place the cuff on your bare arm Adjust snuggly, so that only two fingertips can fit between your skin and the top of the cuff Check 2 readings separated by at least one minute Keep a log of your BP readings For a visual, please reference this diagram: http://ccnc.care/bpdiagram  Provider Name: [facility]     Phone: [phone]  Zone 1: ALL CLEAR  Continue to monitor your symptoms:  BP reading is less than 140 (top number) or less than 90 (bottom number)  No right upper stomach pain No headaches or seeing spots No feeling nauseated or throwing up No swelling in face and hands  Zone 2: CAUTION Call your doctor's office for any of the following:  BP reading is greater than 140 (top number) or greater than 90 (bottom number)  Stomach pain under your ribs in the middle or right side Headaches or seeing spots Feeling nauseated or throwing up Swelling in face and hands  Zone 3: EMERGENCY  Seek immediate medical care if you have any of the following:  BP reading is greater than160 (top number) or greater than 110 (bottom number) Severe headaches not improving with Tylenol Serious difficulty catching your breath Any worsening symptoms from Zone 2  Preterm Labor and Birth Information  The normal length of a pregnancy is 39-41 weeks. Preterm labor is when labor starts before 37 completed weeks of pregnancy. What are the risk factors for preterm labor? Preterm labor is more likely to occur in women who: Have certain infections during pregnancy such as a bladder infection, sexually transmitted infection, or infection inside the uterus (chorioamnionitis). Have a shorter-than-normal cervix. Have gone into preterm labor before. Have had surgery on their cervix. Are younger than age 18  or older than age 51. Are African American. Are pregnant with twins or multiple babies (multiple gestation). Take street drugs or smoke while pregnant. Do not gain enough weight while pregnant. Became pregnant shortly after having been pregnant. What are the symptoms of preterm labor? Symptoms of preterm labor include: Cramps similar to those that can happen during a menstrual period. The cramps may happen with diarrhea. Pain in the abdomen or lower back. Regular uterine contractions that may feel like tightening of the abdomen. A feeling of increased pressure in the pelvis. Increased watery or bloody mucus discharge from the vagina. Water breaking (ruptured amniotic sac). Why is it important to recognize signs of preterm labor? It is important to recognize signs of preterm labor because babies who are born prematurely may not be fully developed. This can put them at an increased risk for: Long-term (chronic) heart and lung problems. Difficulty immediately after birth with regulating body systems, including blood sugar, body temperature, heart rate, and breathing rate. Bleeding in the brain. Cerebral palsy. Learning difficulties. Death. These risks are highest for babies who are born before 34 weeks  of pregnancy. How is preterm labor treated? Treatment depends on the length of your pregnancy, your condition, and the health of your baby. It may involve: Having a stitch (suture) placed in your cervix to prevent your cervix from opening too early (cerclage). Taking or being given medicines, such as: Hormone medicines. These may be given early in pregnancy to help support the pregnancy. Medicine to stop contractions. Medicines to help mature the baby's lungs. These may be prescribed if the risk of delivery is high. Medicines to prevent your baby from developing cerebral palsy. If the labor happens before 34 weeks of pregnancy, you may need to stay in the hospital. What should I do if I  think I am in preterm labor? If you think that you are going into preterm labor, call your health care provider right away. How can I prevent preterm labor in future pregnancies? To increase your chance of having a full-term pregnancy: Do not use any tobacco products, such as cigarettes, chewing tobacco, and e-cigarettes. If you need help quitting, ask your health care provider. Do not use street drugs or medicines that have not been prescribed to you during your pregnancy. Talk with your health care provider before taking any herbal supplements, even if you have been taking them regularly. Make sure you gain a healthy amount of weight during your pregnancy. Watch for infection. If you think that you might have an infection, get it checked right away. Make sure to tell your health care provider if you have gone into preterm labor before. This information is not intended to replace advice given to you by your health care provider. Make sure you discuss any questions you have with your health care provider. Document Revised: 05/02/2018 Document Reviewed: 06/01/2015 Elsevier Patient Education  2020 Elsevier Inc.  Gallbladder Eating Plan High blood cholesterol, obesity, a sedentary lifestyle, an unhealthy diet, and diabetes are risk factors for developing gallstones. If you have a gallbladder condition, you may have trouble digesting fats and tolerating high fat intake. Eating a low-fat diet can help reduce your symptoms and may be helpful before and after having surgery to remove your gallbladder (cholecystectomy). Your health care provider may recommend that you work with a dietitian to help you reduce the amount of fat in your diet. What are tips for following this plan? General guidelines Limit your fat intake to less than 30% of your total daily calories. If you eat around 1,800 calories each day, this means eating less than 60 grams (g) of fat per day. Fat is an important part of a healthy  diet. Eating a low-fat diet can make it hard to maintain a healthy body weight. Ask your dietitian how much fat, calories, and other nutrients you need each day. Eat small, frequent meals throughout the day instead of three large meals. Drink at least 8-10 cups (1.9-2.4 L) of fluid a day. Drink enough fluid to keep your urine pale yellow. If you drink alcohol: Limit how much you have to: 0-1 drink a day for women who are not pregnant. 0-2 drinks a day for men. Know how much alcohol is in a drink. In the [place] one drink equals one 12 oz bottle of beer (355 mL), one 5 oz glass of wine (148 mL), or one 1 oz glass of hard liquor (44 mL). Reading food labels  Check nutrition facts on food labels for the amount of fat per serving. Choose foods with less than 3 grams of fat per serving. Shopping Choose nonfat  and low-fat healthy foods. Look for the words "nonfat," "low-fat," or "fat-free." Avoid buying processed or prepackaged foods. Cooking Cook using low-fat methods, such as baking, broiling, grilling, or boiling. Cook with small amounts of healthy fats, such as olive oil, grapeseed oil, canola oil, avocado oil, or sunflower oil. What foods are recommended?  All fresh, frozen, or canned fruits and vegetables. Whole grains. Low-fat or nonfat (skim) milk and yogurt. Lean meat, skinless poultry, fish, eggs, and beans. Low-fat protein supplement powders or drinks. Spices and herbs. The items listed above may not be a complete list of foods and beverages you can eat and drink. Contact a dietitian for more information. What foods are not recommended? High-fat foods. These include baked goods, fast food, fatty cuts of meat, ice cream, french toast, sweet rolls, pizza, cheese bread, foods covered with butter, creamy sauces, or cheese. Fried foods. These include french fries, tempura, battered fish, breaded chicken, fried breads, and sweets. Foods that cause bloating and gas. The items listed above  may not be a complete list of foods that you should avoid. Contact a dietitian for more information. Summary A low-fat diet can be helpful if you have a gallbladder condition, or before and after gallbladder surgery. Limit your fat intake to less than 30% of your total daily calories. This is about 60 g of fat if you eat 1,800 calories each day. Eat small, frequent meals throughout the day instead of three large meals. This information is not intended to replace advice given to you by your health care provider. Make sure you discuss any questions you have with your health care provider. Document Revised: 12/23/2020 Document Reviewed: 12/23/2020 Elsevier Patient Education  2024 [employer].

## 2023-04-16 ENCOUNTER — Encounter: Payer: Self-pay | Admitting: Women's Health

## 2023-04-16 ENCOUNTER — Ambulatory Visit: Payer: MEDICAID | Admitting: Women's Health

## 2023-04-16 VITALS — BP 124/84 | HR 111 | Wt 224.0 lb

## 2023-04-16 DIAGNOSIS — Z3480 Encounter for supervision of other normal pregnancy, unspecified trimester: Secondary | ICD-10-CM

## 2023-04-16 DIAGNOSIS — Z3A34 34 weeks gestation of pregnancy: Secondary | ICD-10-CM | POA: Diagnosis not present

## 2023-04-16 DIAGNOSIS — Z3689 Encounter for other specified antenatal screening: Secondary | ICD-10-CM | POA: Diagnosis not present

## 2023-04-16 DIAGNOSIS — Z3483 Encounter for supervision of other normal pregnancy, third trimester: Secondary | ICD-10-CM | POA: Diagnosis not present

## 2023-04-16 NOTE — Progress Notes (Signed)
 LOW-RISK PREGNANCY VISIT Patient name: Amy Barrera MRN [id number]  Date of birth: [birth year] Chief Complaint:   Routine Prenatal Visit  History of Present Illness:   Amy Barrera is a 23 y.o. G65P1001 female at [redacted]w[redacted]d with an Estimated Date of Delivery: 05/28/23 being seen today for ongoing management of a low-risk pregnancy.   Today she reports  home bp's better except one random very high bp 170s/120s- then was back to normal right after, so likely not accurate . Denies ha, visual changes, ruq/epigastric pain, n/v.  Did not get labs last week d/t lab issue. Contractions: Irregular. Vag. Bleeding: None.  Movement: Present. denies leaking of fluid.     03/05/2023    9:03 AM 12/05/2022    9:46 AM 04/27/2021    9:37 AM 01/12/2021    3:02 PM  Depression screen PHQ 2/9  Decreased Interest 0 0 0 0  Down, Depressed, Hopeless 0 0 0 0  PHQ - 2 Score 0 0 0 0  Altered sleeping 2 0 0 0  Tired, decreased energy 1 0 0 0  Change in appetite 0 0 0 0  Feeling bad or failure about yourself  2 0 0 0  Trouble concentrating  0 0 0  Moving slowly or fidgety/restless 0 0 0 0  Suicidal thoughts 0 0 0 0  PHQ-9 Score 5 0 0 0  Difficult doing work/chores    Not difficult at all        12/05/2022    9:46 AM 04/27/2021    9:37 AM 01/12/2021    3:02 PM  GAD 7 : Generalized Anxiety Score  Nervous, Anxious, on Edge 0 0 0  Control/stop worrying 0 0 0  Worry too much - different things 0 0 0  Trouble relaxing 0 0 0  Restless 0 0 0  Easily annoyed or irritable 0 0 0  Afraid - awful might happen 0 0 0  Total GAD 7 Score 0 0 0  Anxiety Difficulty   Not difficult at all      Review of Systems:   Pertinent items are noted in HPI Denies abnormal vaginal discharge w/ itching/odor/irritation, headaches, visual changes, shortness of breath, chest pain, abdominal pain, severe nausea/vomiting, or problems with urination or bowel movements unless otherwise stated above. Pertinent History Reviewed:   Reviewed past medical,surgical, social, obstetrical and family history.  Reviewed problem list, medications and allergies. Physical Assessment:   Vitals:   04/16/23 0900  BP: 124/84  Pulse: (!) 111  Weight: 224 lb (101.6 kg)  Body mass index is 34.06 kg/m.        Physical Examination:   General appearance: Well appearing, and in no distress  Mental status: Alert, oriented to person, place, and time  Skin: Warm & dry  Cardiovascular: Normal heart rate noted  Respiratory: Normal respiratory effort, no distress  Abdomen: Soft, gravid, nontender  Pelvic: Cervical exam deferred         Extremities: Edema: None  Fetal Status: Fetal Heart Rate (bpm): 132 Fundal Height: 34 cm Movement: Present    Chaperone: N/A No results found for this or any previous visit (from the past 24 hours).  Assessment & Plan:  1) Low-risk pregnancy G2P1001 at [redacted]w[redacted]d with an Estimated Date of Delivery: 05/28/23   2) H/O GHTN, ASA, check bp daily, if >140/90 or pre-e s/s, let us know  3) EFW 92% @ 19w> has EFW u/s next week   Meds: No orders of the defined types  were placed in this encounter.  Labs/procedures today: none  Plan:  Continue routine obstetrical care  Next visit: prefers will be in person for u/s and cultures     Reviewed: Preterm labor symptoms and general obstetric precautions including but not limited to vaginal bleeding, contractions, leaking of fluid and fetal movement were reviewed in detail with the patient.  All questions were answered. Does have home bp cuff. Office bp cuff given: not applicable. Check bp daily, let us know if consistently >140 and/or >90.  Follow-up: Return for As scheduled.  Future Appointments  Date Time Provider Department Center  04/30/2023  9:15 AM [facility] None  04/30/2023 10:10 AM Cheral Marker, CNM [facility]    Orders Placed This Encounter  Procedures   US OB Follow Up   Cheral Marker CNM, [facility] 04/16/2023 9:25 AM

## 2023-04-16 NOTE — Patient Instructions (Signed)
Pina, thank you for choosing our office today! We appreciate the opportunity to meet your healthcare needs. You may receive a short survey by mail, e-mail, or through MyChart. If you are happy with your care we would appreciate if you could take just a few minutes to complete the survey questions. We read all of your comments and take your feedback very seriously. Thank you again for choosing our office.  [facility]  [facility] ([address] [place], [place] [address]) Entrance C, located off of [address] Free 24/7 valet parking   CLASSES: Go to [email or web address] to register for classes (childbirth, breastfeeding, waterbirth, infant CPR, daddy bootcamp, etc.)  Call the office ([phone]) or go to [facility] if: You begin to have strong, frequent contractions Your water breaks.  Sometimes it is a big gush of fluid, sometimes it is just a trickle that keeps getting your panties wet or running down your legs You have vaginal bleeding.  It is normal to have a small amount of spotting if your cervix was checked.  You don't feel your baby moving like normal.  If you don't, get you something to eat and drink and lay down and focus on feeling your baby move.   If your baby is still not moving like normal, you should call the office or go to [facility].  Call the office ([phone]) or go to [facility] for these signs of pre-eclampsia: Severe headache that does not go away with Tylenol Visual changes- seeing spots, double, blurred vision Pain under your right breast or upper abdomen that does not go away with Tums or heartburn medicine Nausea and/or vomiting Severe swelling in your hands, feet, and face   Tdap Vaccine It is recommended that you get the Tdap vaccine during the third trimester of EACH pregnancy to help protect your baby from getting pertussis (whooping cough) 27-36 weeks is the BEST time to do  this so that you can pass the protection on to your baby. During pregnancy is better than after pregnancy, but if you are unable to get it during pregnancy it will be offered at the hospital.  You can get this vaccine with us, at the health department, your family doctor, or some local pharmacies Everyone who will be around your baby should also be up-to-date on their vaccines before the baby comes. Adults (who are not pregnant) only need 1 dose of Tdap during adulthood.   [place] Pediatricians/Family Doctors [facility] ([facility]): [address], [phone]           [facility]: [address], [phone]                [facility] ([facility]): [address], [phone] (call to ask if accepting patients) [facility]: [address], [place], [phone]    [place] Pediatricians/Family Doctors [facility] ([facility]): [address], [phone] [facility]: [address], [phone] [facility]: [address], [phone]  [place] [facility]  [facility] ([facility]): [phone] [facility]: [address], [phone]   [place] Family Doctors [facility]: [address], [phone]  [place] [facility]  [facility]: [address], [phone]  Home Blood Pressure Monitoring for Patients   Your provider has recommended that you check your   blood pressure (BP) at least once a week at home. If you do not have a blood pressure cuff at home, one will be provided for you. Contact your provider if you have not received your monitor within 1 week.   Helpful Tips for Accurate Home Blood Pressure Checks  Don't smoke, exercise, or drink caffeine 30 minutes before checking your BP Use the restroom before checking your BP (a full bladder can raise your  pressure) Relax in a comfortable upright chair Feet on the ground Left arm resting comfortably on a flat surface at the level of your heart Legs uncrossed Back supported Sit quietly and don't talk Place the cuff on your bare arm Adjust snuggly, so that only two fingertips can fit between your skin and the top of the cuff Check 2 readings separated by at least one minute Keep a log of your BP readings For a visual, please reference this diagram: http://ccnc.care/bpdiagram  Provider Name: [facility]     Phone: [phone]  Zone 1: ALL CLEAR  Continue to monitor your symptoms:  BP reading is less than 140 (top number) or less than 90 (bottom number)  No right upper stomach pain No headaches or seeing spots No feeling nauseated or throwing up No swelling in face and hands  Zone 2: CAUTION Call your doctor's office for any of the following:  BP reading is greater than 140 (top number) or greater than 90 (bottom number)  Stomach pain under your ribs in the middle or right side Headaches or seeing spots Feeling nauseated or throwing up Swelling in face and hands  Zone 3: EMERGENCY  Seek immediate medical care if you have any of the following:  BP reading is greater than160 (top number) or greater than 110 (bottom number) Severe headaches not improving with Tylenol Serious difficulty catching your breath Any worsening symptoms from Zone 2  Preterm Labor and Birth Information  The normal length of a pregnancy is 39-41 weeks. Preterm labor is when labor starts before 37 completed weeks of pregnancy. What are the risk factors for preterm labor? Preterm labor is more likely to occur in women who: Have certain infections during pregnancy such as a bladder infection, sexually transmitted infection, or infection inside the uterus (chorioamnionitis). Have a shorter-than-normal cervix. Have gone into preterm labor before. Have had surgery on their cervix. Are younger than age 17  or older than age 35. Are African American. Are pregnant with twins or multiple babies (multiple gestation). Take street drugs or smoke while pregnant. Do not gain enough weight while pregnant. Became pregnant shortly after having been pregnant. What are the symptoms of preterm labor? Symptoms of preterm labor include: Cramps similar to those that can happen during a menstrual period. The cramps may happen with diarrhea. Pain in the abdomen or lower back. Regular uterine contractions that may feel like tightening of the abdomen. A feeling of increased pressure in the pelvis. Increased watery or bloody mucus discharge from the vagina. Water breaking (ruptured amniotic sac). Why is it important to recognize signs of preterm labor? It is important to recognize signs of preterm labor because babies who are born prematurely may not be fully developed. This can put them at an increased risk for: Long-term (chronic) heart and lung problems. Difficulty immediately after birth with regulating body systems, including blood sugar, body temperature, heart rate, and breathing rate. Bleeding in the brain. Cerebral palsy. Learning difficulties. Death. These risks are highest for babies who are born before 34 weeks   of pregnancy. How is preterm labor treated? Treatment depends on the length of your pregnancy, your condition, and the health of your baby. It may involve: Having a stitch (suture) placed in your cervix to prevent your cervix from opening too early (cerclage). Taking or being given medicines, such as: Hormone medicines. These may be given early in pregnancy to help support the pregnancy. Medicine to stop contractions. Medicines to help mature the baby's lungs. These may be prescribed if the risk of delivery is high. Medicines to prevent your baby from developing cerebral palsy. If the labor happens before 34 weeks of pregnancy, you may need to stay in the hospital. What should I do if I  think I am in preterm labor? If you think that you are going into preterm labor, call your health care provider right away. How can I prevent preterm labor in future pregnancies? To increase your chance of having a full-term pregnancy: Do not use any tobacco products, such as cigarettes, chewing tobacco, and e-cigarettes. If you need help quitting, ask your health care provider. Do not use street drugs or medicines that have not been prescribed to you during your pregnancy. Talk with your health care provider before taking any herbal supplements, even if you have been taking them regularly. Make sure you gain a healthy amount of weight during your pregnancy. Watch for infection. If you think that you might have an infection, get it checked right away. Make sure to tell your health care provider if you have gone into preterm labor before. This information is not intended to replace advice given to you by your health care provider. Make sure you discuss any questions you have with your health care provider. Document Revised: 05/02/2018 Document Reviewed: 06/01/2015 Elsevier Patient Education  2020 Elsevier Inc.   

## 2023-04-23 ENCOUNTER — Encounter: Payer: Self-pay | Admitting: Women's Health

## 2023-04-23 ENCOUNTER — Encounter: Payer: Self-pay | Admitting: Family Medicine

## 2023-04-26 IMAGING — US US MFM OB COMP +14 WKS
1 series · 13 of 28 positions shown · non-contrast
Comparison: none

[Series 1: us mfm ob comp +14 wks · 13 of 99 slices shown]
[im 4/99]
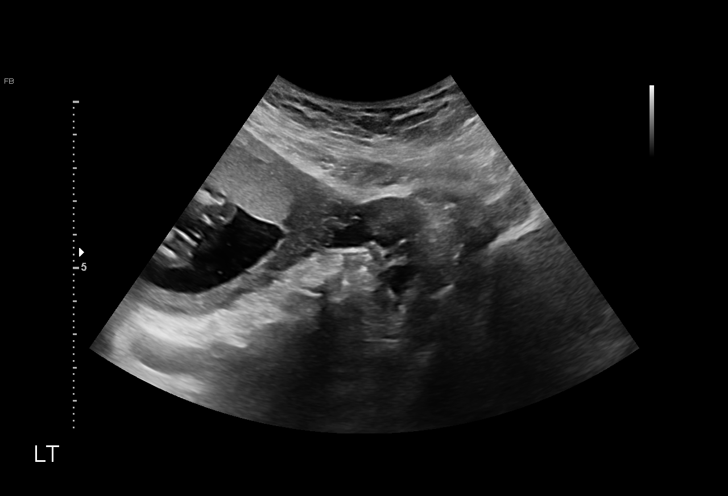
[im 11/99]
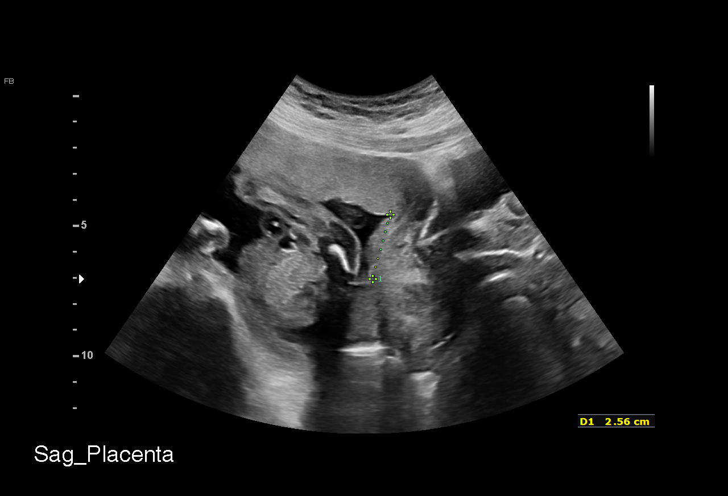
[im 19/99]
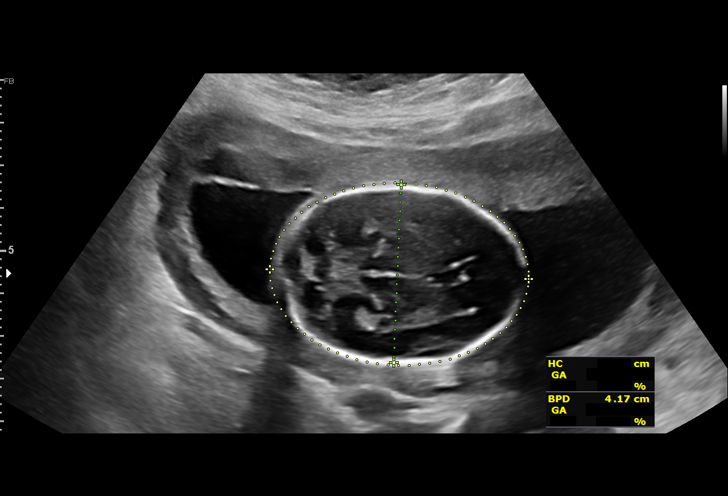
[im 26/99]
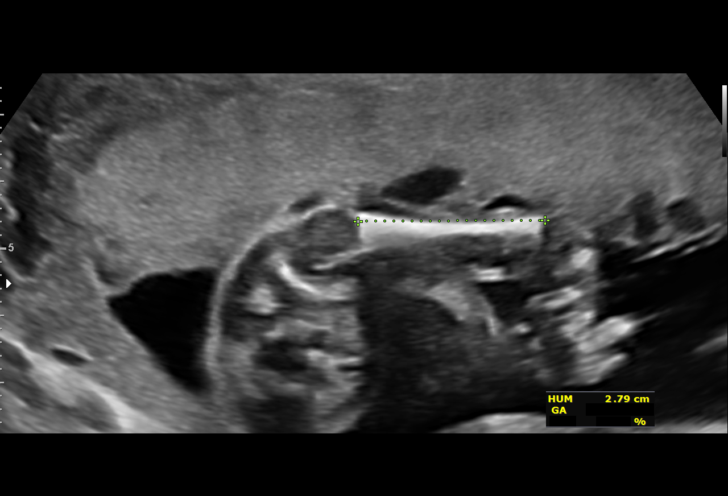
[im 33/99]
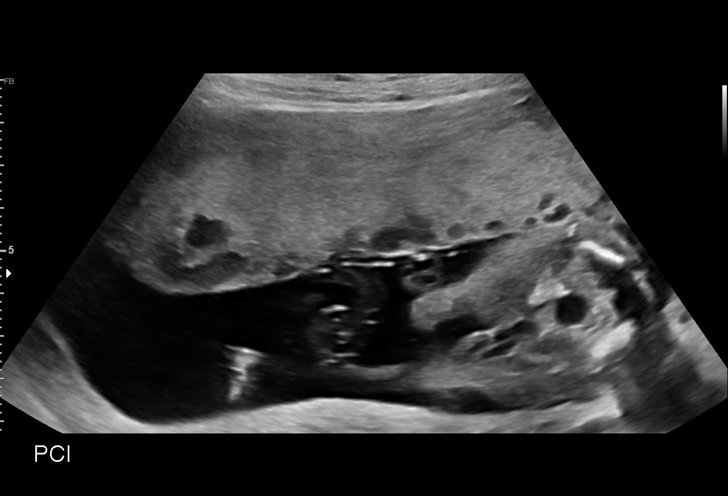
[im 40/99]
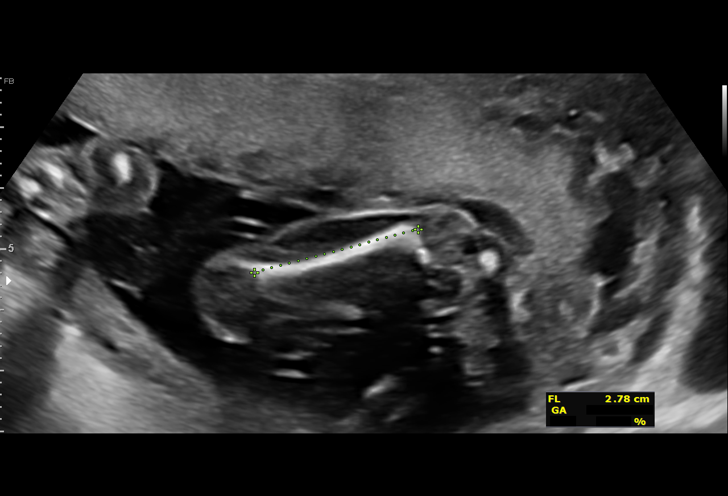
[im 51/99]
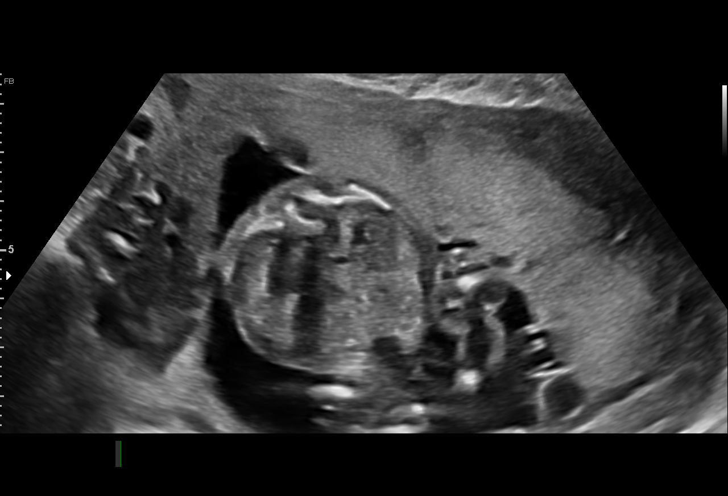
[im 59/99]
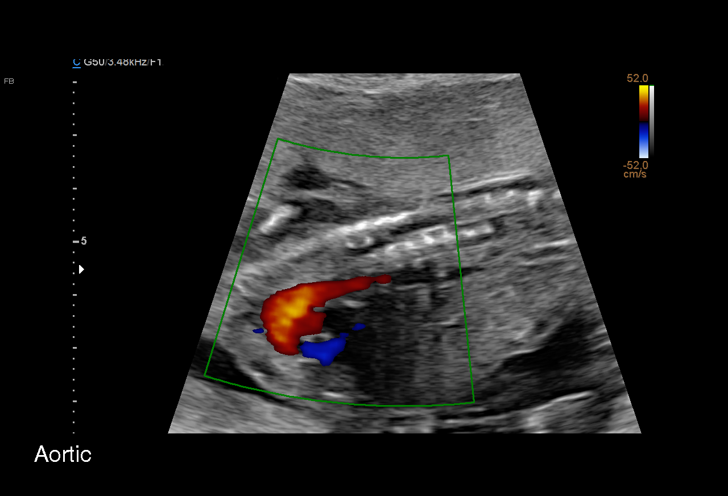
[im 66/99]
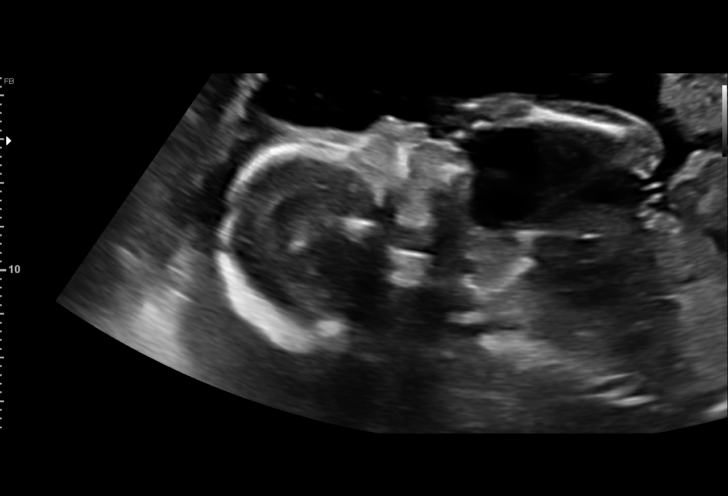
[im 73/99]
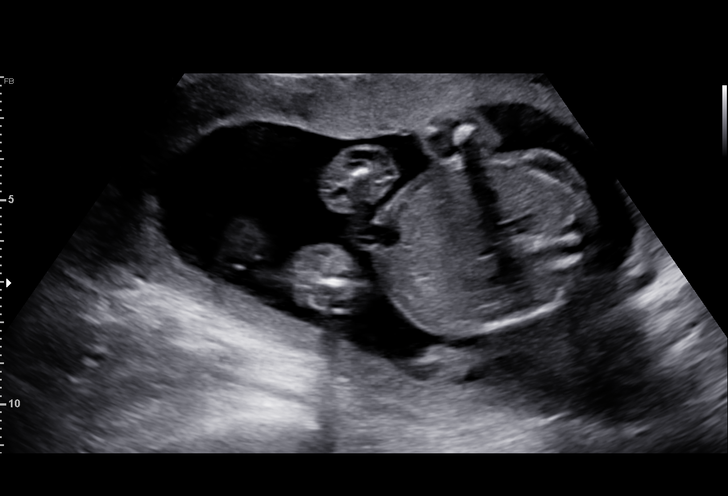
[im 80/99]
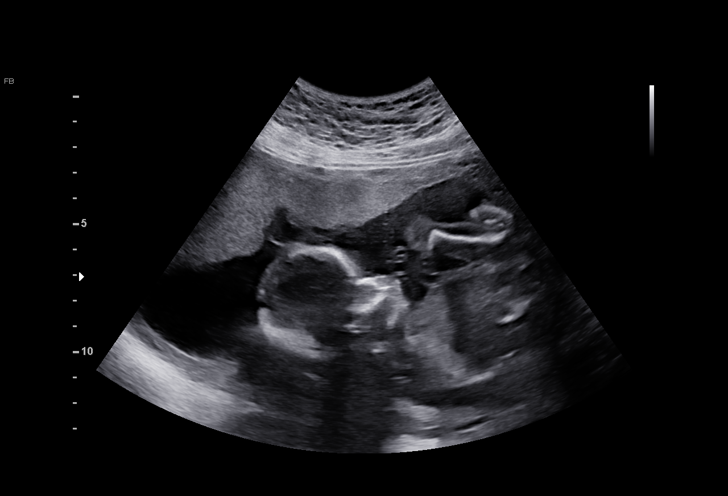
[im 88/99]
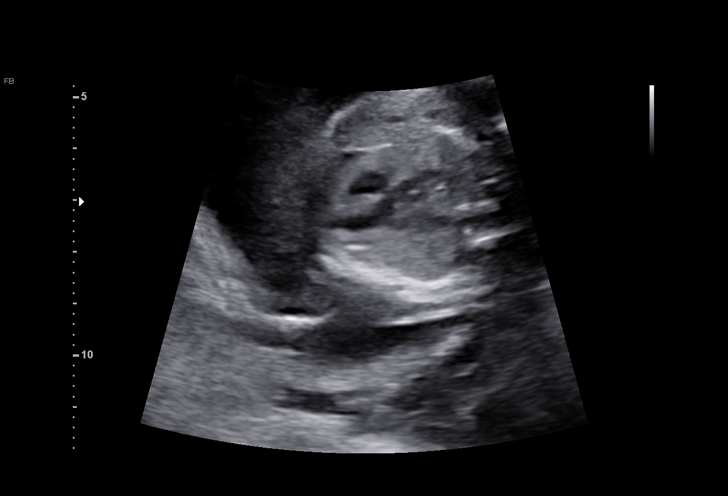
[im 95/99]
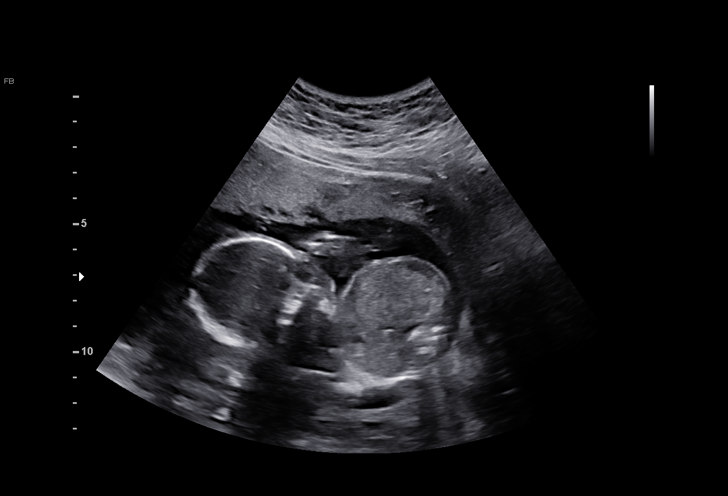

[13 of 28 positions shown; findings below may reference images not displayed]

1  US MFM OB COMP + 14 WK                76805.01    MARVELI AHON

Indications

 Bipolar disease during pregnancy
 Encounter for antenatal screening for
 malformations
 19 weeks gestation of pregnancy
 Genetic carrier (Aliza Olea silent carrier)
 Neg AFP/ LR NIPS
Fetal Evaluation

 Num Of Fetuses:         1
 Fetal Heart Rate(bpm):  153
 Cardiac Activity:       Observed
 Presentation:           Breech
 Placenta:               Anterior
 P. Cord Insertion:      Visualized, central

 Amniotic Fluid
 AFI FV:      Within normal limits

                             Largest Pocket(cm)

Biometry

 BPD:      41.6  mm     G. Age:  18w 4d         33  %    CI:        66.11   %    70 - 86
                                                         FL/HC:      16.8   %    16.1 -
 HC:      164.1  mm     G. Age:  19w 1d         50  %    HC/AC:      1.07        1.09 -
 AC:      153.3  mm     G. Age:  20w 4d         89  %    FL/BPD:     66.1   %
 FL:       27.5  mm     G. Age:  18w 3d         23  %    FL/AC:      17.9   %    20 - 24
 HUM:      27.6  mm     G. Age:  18w 6d         46  %
 CER:      18.2  mm     G. Age:  18w 0d          5  %
 NFT:       1.9  mm
 LV:        6.7  mm
 CM:        3.6  mm

 Est. FW:     295  gm    0 lb 10 oz      74  %
OB History

 Gravidity:    1
Gestational Age

 LMP:           25w 5d        Date:  09/02/20                 EDD:   06/09/21
 Clinical EDD:  19w 0d                                        EDD:   07/26/21
 U/S Today:     19w 1d                                        EDD:   07/25/21
 Best:          19w 0d     Det. By:  Early Ultrasound         EDD:   07/26/21
                                     (12/02/20)
Anatomy

 Cranium:               Appears normal         Aortic Arch:            Appears normal
 Cavum:                 Appears normal         Ductal Arch:            Not well visualized
 Ventricles:            Appears normal         Diaphragm:              Appears normal
 Choroid Plexus:        Appears normal         Stomach:                Appears normal, left
                                                                       sided
 Cerebellum:            Appears normal         Abdomen:                Appears normal
 Posterior Fossa:       Appears normal         Abdominal Wall:         Appears nml (cord
                                                                       insert, abd wall)
 Nuchal Fold:           Appears normal         Cord Vessels:           Appears normal (3
                                                                       vessel cord)
 Face:                  Appears normal         Kidneys:                Appear normal
                        (orbits and profile)
 Lips:                  Appears normal         Bladder:                Appears normal
 Thoracic:              Appears normal         Spine:                  Appears normal
 Heart:                 Not well visualized    Upper Extremities:      Visualized
 RVOT:                  Not well visualized    Lower Extremities:      Visualized
 LVOT:                  Not well visualized

 Other:  Fetus appears to be female. Nasal bone visualized. Lenses visualized.
Cervix Uterus Adnexa

 Cervix
 Length:           3.54  cm.
 Normal appearance by transabdominal scan.

 Adnexa
 No abnormality visualized.
Comments

 This patient was seen for a detailed fetal anatomy scan.
 She denies any significant past medical history and denies
 any problems in her current pregnancy.
 She had a cell free DNA test earlier in her pregnancy which
 indicated a low risk for trisomy 21, 18, and 13. A female fetus
 is predicted.
 She was informed that the fetal growth and amniotic fluid
 level were appropriate for her gestational age.
 There were no obvious fetal anomalies noted on today's
 ultrasound exam.  However, the views of the fetal anatomy
 were limited today due to the fetal position.
 The patient was informed that anomalies may be missed due
 to technical limitations. If the fetus is in a suboptimal position
 or maternal habitus is increased, visualization of the fetus in
 the maternal uterus may be impaired.
 A follow-up exam was scheduled in 4 weeks to complete the
 views of the fetal anatomy.

## 2023-04-30 ENCOUNTER — Ambulatory Visit: Payer: MEDICAID | Admitting: Obstetrics and Gynecology

## 2023-04-30 ENCOUNTER — Ambulatory Visit: Payer: MEDICAID | Admitting: Radiology

## 2023-04-30 ENCOUNTER — Encounter: Payer: Self-pay | Admitting: Obstetrics and Gynecology

## 2023-04-30 VITALS — BP 129/82 | HR 93 | Wt 221.0 lb

## 2023-04-30 DIAGNOSIS — Z3483 Encounter for supervision of other normal pregnancy, third trimester: Secondary | ICD-10-CM

## 2023-04-30 DIAGNOSIS — Z3689 Encounter for other specified antenatal screening: Secondary | ICD-10-CM

## 2023-04-30 DIAGNOSIS — O321XX0 Maternal care for breech presentation, not applicable or unspecified: Secondary | ICD-10-CM | POA: Diagnosis not present

## 2023-04-30 DIAGNOSIS — R519 Headache, unspecified: Secondary | ICD-10-CM

## 2023-04-30 DIAGNOSIS — Z3A36 36 weeks gestation of pregnancy: Secondary | ICD-10-CM

## 2023-04-30 DIAGNOSIS — O26893 Other specified pregnancy related conditions, third trimester: Secondary | ICD-10-CM | POA: Diagnosis not present

## 2023-04-30 DIAGNOSIS — Z3480 Encounter for supervision of other normal pregnancy, unspecified trimester: Secondary | ICD-10-CM

## 2023-04-30 MED ORDER — BUTALBITAL-APAP-CAFFEINE 50-325-40 MG PO CAPS: 1.0000 | ORAL_CAPSULE | Freq: Four times a day (QID) | ORAL | 0 refills | Status: DC | PRN

## 2023-04-30 NOTE — Progress Notes (Signed)
 [place]: GA = 36 weeks Single active female fetus, complete breech, FHR = 146 bpm, anterior fundal pl, gr2 AFI = 21.1 cm, 86%, MVP = 9.6 cm,  EFW 96% AC 96%, 3469g

## 2023-04-30 NOTE — Progress Notes (Unsigned)
   PRENATAL VISIT NOTE  Subjective:  Amy Barrera is a 23 y.o. G2P1001 at [redacted]w[redacted]d being seen today for ongoing prenatal care.  She is currently monitored for the following issues for this low-risk pregnancy and has Bony abnormality; Primary familial hypertrophic cardiomyopathy (HCC); Alpha thalassemia silent carrier; Postpartum hemorrhage; History of gestational hypertension; Encounter for supervision of normal pregnancy, antepartum; Tachycardia; and Inappropriate sinus tachycardia (HCC) on their problem list.  Patient reports {sx:14538}.  Contractions: Irregular. Vag. Bleeding: None.  Movement: Present. Denies leaking of fluid.   The following portions of the patient's history were reviewed and updated as appropriate: allergies, current medications, past family history, past medical history, past social history, past surgical history and problem list.   Objective:   Vitals:   04/30/23 1030  BP: 129/82  Pulse: 93  Weight: 221 lb (100.2 kg)    Fetal Status:     Movement: Present     General:  Alert, oriented and cooperative. Patient is in no acute distress.  Skin: Skin is warm and dry. No rash noted.   Cardiovascular: Normal heart rate noted  Respiratory: Normal respiratory effort, no problems with respiration noted  Abdomen: Soft, gravid, appropriate for gestational age.  Pain/Pressure: Present     Pelvic: {Blank single:19197::"Cervical exam performed in the presence of a chaperone","Cervical exam deferred"}        Extremities: Normal range of motion.  Edema: None  Mental Status: Normal mood and affect. Normal behavior. Normal judgment and thought content.   Assessment and Plan:  Pregnancy: G2P1001 at [redacted]w[redacted]d 1. Supervision of other normal pregnancy, antepartum (Primary) ***  2. [redacted] weeks gestation of pregnancy *** - Culture, beta strep (group b only) - Cervicovaginal ancillary only( [place])  {Blank single:19197::"Term","Preterm"} labor symptoms and general obstetric  precautions including but not limited to vaginal bleeding, contractions, leaking of fluid and fetal movement were reviewed in detail with the patient. Please refer to After Visit Summary for other counseling recommendations.   No follow-ups on file.  No future appointments.  Albertine Grates, FNP

## 2023-05-01 ENCOUNTER — Encounter: Payer: Self-pay | Admitting: Obstetrics & Gynecology

## 2023-05-01 LAB — CERVICOVAGINAL ANCILLARY ONLY
Chlamydia: NEGATIVE
Comment: NEGATIVE
Comment: NORMAL
Neisseria Gonorrhea: NEGATIVE

## 2023-05-02 ENCOUNTER — Encounter: Payer: Self-pay | Admitting: Obstetrics & Gynecology

## 2023-05-02 LAB — PROTEIN / CREATININE RATIO, URINE
Creatinine, Urine: 231.9 mg/dL
Protein, Ur: 60.9 mg/dL
Protein/Creat Ratio: 263 mg/g{creat} — ABNORMAL HIGH (ref 0–200)

## 2023-05-02 LAB — COMPREHENSIVE METABOLIC PANEL WITH GFR
ALT: 13 IU/L (ref 0–32)
AST: 12 IU/L (ref 0–40)
Albumin: 3.8 g/dL — ABNORMAL LOW (ref 4.0–5.0)
Alkaline Phosphatase: 202 IU/L — ABNORMAL HIGH (ref 42–106)
BUN/Creatinine Ratio: 12 (ref 9–23)
BUN: 6 mg/dL (ref 6–20)
Bilirubin Total: <0.2 mg/dL (ref 0.0–1.2)
CO2: 19 mmol/L — ABNORMAL LOW (ref 20–29)
Calcium: 9.3 mg/dL (ref 8.7–10.2)
Chloride: 104 mmol/L (ref 96–106)
Creatinine, Ser: 0.5 mg/dL — ABNORMAL LOW (ref 0.57–1.00)
Globulin, Total: 3.1 g/dL (ref 1.5–4.5)
Glucose: 98 mg/dL (ref 70–99)
Potassium: 4.4 mmol/L (ref 3.5–5.2)
Sodium: 140 mmol/L (ref 134–144)
Total Protein: 6.9 g/dL (ref 6.0–8.5)
eGFR: 138 mL/min/{1.73_m2} (ref >59)

## 2023-05-02 LAB — CBC
Hematocrit: 37.3 % (ref 34.0–46.6)
Hemoglobin: 11.8 g/dL (ref 11.1–15.9)
MCH: 23.2 pg — ABNORMAL LOW (ref 26.6–33.0)
MCHC: 31.6 g/dL (ref 31.5–35.7)
MCV: 73 fL — ABNORMAL LOW (ref 79–97)
Platelets: 390 10*3/uL (ref 150–450)
RBC: 5.08 x10E6/uL (ref 3.77–5.28)
RDW: 14.2 % (ref 11.7–15.4)
WBC: 8.6 10*3/uL (ref 3.4–10.8)

## 2023-05-03 ENCOUNTER — Encounter: Payer: Self-pay | Admitting: *Deleted

## 2023-05-04 LAB — CULTURE, BETA STREP (GROUP B ONLY): Strep Gp B Culture: NEGATIVE

## 2023-05-06 ENCOUNTER — Encounter: Payer: Self-pay | Admitting: Obstetrics and Gynecology

## 2023-05-06 ENCOUNTER — Encounter (HOSPITAL_COMMUNITY): Payer: Self-pay | Admitting: *Deleted

## 2023-05-06 ENCOUNTER — Telehealth (HOSPITAL_COMMUNITY): Payer: Self-pay | Admitting: *Deleted

## 2023-05-06 ENCOUNTER — Ambulatory Visit: Payer: MEDICAID | Admitting: Obstetrics and Gynecology

## 2023-05-06 VITALS — BP 132/83 | HR 134 | Wt 223.0 lb

## 2023-05-06 DIAGNOSIS — O133 Gestational [pregnancy-induced] hypertension without significant proteinuria, third trimester: Secondary | ICD-10-CM | POA: Diagnosis not present

## 2023-05-06 DIAGNOSIS — O0993 Supervision of high risk pregnancy, unspecified, third trimester: Secondary | ICD-10-CM

## 2023-05-06 DIAGNOSIS — Z3A36 36 weeks gestation of pregnancy: Secondary | ICD-10-CM | POA: Diagnosis not present

## 2023-05-06 LAB — POCT URINALYSIS DIPSTICK OB
Blood, UA: NEGATIVE
Glucose, UA: NEGATIVE
Ketones, UA: NEGATIVE
Leukocytes, UA: NEGATIVE
Nitrite, UA: NEGATIVE

## 2023-05-06 NOTE — Progress Notes (Unsigned)
   PRENATAL VISIT NOTE  Subjective:  Amy Barrera is a 23 y.o. G2P1001 at [redacted]w[redacted]d being seen today for ongoing prenatal care.  She is currently monitored for the following issues for this low-risk pregnancy and has Bony abnormality; Primary familial hypertrophic cardiomyopathy (HCC); Alpha thalassemia silent carrier; Postpartum hemorrhage; History of gestational hypertension; Encounter for supervision of normal pregnancy, antepartum; Tachycardia; and Inappropriate sinus tachycardia (HCC) on their problem list.  Patient reports  headache subsided  .  Contractions: Irritability. Vag. Bleeding: None.  Movement: Present. Denies leaking of fluid.   The following portions of the patient's history were reviewed and updated as appropriate: allergies, current medications, past family history, past medical history, past social history, past surgical history and problem list.   Objective:   Vitals:   05/06/23 1412  BP: 132/83  Pulse: (!) 134  Weight: 223 lb (101.2 kg)    Fetal Status:     Movement: Present     General:  Alert, oriented and cooperative. Patient is in no acute distress.  Skin: Skin is warm and dry. No rash noted.   Cardiovascular: Normal heart rate noted  Respiratory: Normal respiratory effort, no problems with respiration noted  Abdomen: Soft, gravid, appropriate for gestational age.  Pain/Pressure: Present     Pelvic: Cervical exam deferred        Extremities: Normal range of motion.  Edema: None  Mental Status: Normal mood and affect. Normal behavior. Normal judgment and thought content.   Assessment and Plan:  Pregnancy: G2P1001 at [redacted]w[redacted]d 1. [redacted] weeks gestation of pregnancy Swabs collected last visit - POC Urinalysis Dipstick OB  2. Supervision of high risk pregnancy in third trimester (Primary) Doing well, feeling regular movement  NST reactive  - POC Urinalysis Dipstick OB  3. Gestational hypertension, third trimester New onset, normotensive today. Scheduled IOL  after ECV / 4/18 Continue monitor symptoms follow up if Pre e s&s   Preterm labor symptoms and general obstetric precautions including but not limited to vaginal bleeding, contractions, leaking of fluid and fetal movement were reviewed in detail with the patient. Please refer to After Visit Summary for other counseling recommendations.   Return cancel 4/17, please print work note.  Future Appointments  Date Time Provider Department Center  05/09/2023 10:50 AM Ferd Householder, CNM [facility]  05/10/2023  7:15 AM [facility] SCHED ROOM [facility] None  05/10/2023  8:00 AM [facility] SCHED ROOM [facility] None    Susi Eric, FNP

## 2023-05-06 NOTE — Telephone Encounter (Signed)
 Preadmission screen

## 2023-05-08 ENCOUNTER — Other Ambulatory Visit (HOSPITAL_COMMUNITY): Payer: MEDICAID

## 2023-05-09 ENCOUNTER — Encounter (HOSPITAL_COMMUNITY): Payer: MEDICAID

## 2023-05-09 ENCOUNTER — Encounter: Payer: MEDICAID | Admitting: Women's Health

## 2023-05-09 ENCOUNTER — Encounter (HOSPITAL_COMMUNITY): Payer: Self-pay

## 2023-05-10 ENCOUNTER — Inpatient Hospital Stay (HOSPITAL_COMMUNITY): Payer: MEDICAID | Admitting: Anesthesiology

## 2023-05-10 ENCOUNTER — Inpatient Hospital Stay (HOSPITAL_COMMUNITY): Admission: RE | Admit: 2023-05-10 | Payer: MEDICAID | Source: Home / Self Care | Admitting: Obstetrics and Gynecology

## 2023-05-10 ENCOUNTER — Inpatient Hospital Stay (HOSPITAL_COMMUNITY): Admission: AD | Admit: 2023-05-10 | Payer: MEDICAID | Source: Home / Self Care | Admitting: Obstetrics and Gynecology

## 2023-05-10 ENCOUNTER — Inpatient Hospital Stay (HOSPITAL_COMMUNITY): Payer: MEDICAID

## 2023-05-10 ENCOUNTER — Other Ambulatory Visit: Payer: Self-pay

## 2023-05-10 ENCOUNTER — Encounter (HOSPITAL_COMMUNITY)
Admission: RE | Disposition: A | Discharge status: Home / Self Care | Payer: Self-pay | Source: Home / Self Care | Attending: Obstetrics and Gynecology

## 2023-05-10 ENCOUNTER — Encounter (HOSPITAL_COMMUNITY): Payer: Self-pay | Admitting: Obstetrics and Gynecology

## 2023-05-10 ENCOUNTER — Observation Stay (HOSPITAL_COMMUNITY): Payer: MEDICAID

## 2023-05-10 ENCOUNTER — Inpatient Hospital Stay (HOSPITAL_COMMUNITY)
Admission: RE | Admit: 2023-05-10 | Discharge: 2023-05-12 | DRG: 807 | Disposition: A | Discharge status: Home / Self Care | Payer: MEDICAID | Attending: Obstetrics and Gynecology | Admitting: Obstetrics and Gynecology

## 2023-05-10 DIAGNOSIS — O134 Gestational [pregnancy-induced] hypertension without significant proteinuria, complicating childbirth: Principal | ICD-10-CM | POA: Diagnosis present

## 2023-05-10 DIAGNOSIS — Z87891 Personal history of nicotine dependence: Secondary | ICD-10-CM

## 2023-05-10 DIAGNOSIS — Z3A37 37 weeks gestation of pregnancy: Secondary | ICD-10-CM

## 2023-05-10 DIAGNOSIS — Z148 Genetic carrier of other disease: Secondary | ICD-10-CM

## 2023-05-10 DIAGNOSIS — O321XX0 Maternal care for breech presentation, not applicable or unspecified: Secondary | ICD-10-CM | POA: Diagnosis present

## 2023-05-10 DIAGNOSIS — Z7982 Long term (current) use of aspirin: Secondary | ICD-10-CM

## 2023-05-10 DIAGNOSIS — Z560 Unemployment, unspecified: Secondary | ICD-10-CM | POA: Diagnosis not present

## 2023-05-10 DIAGNOSIS — Z3490 Encounter for supervision of normal pregnancy, unspecified, unspecified trimester: Principal | ICD-10-CM

## 2023-05-10 DIAGNOSIS — O139 Gestational [pregnancy-induced] hypertension without significant proteinuria, unspecified trimester: Secondary | ICD-10-CM | POA: Diagnosis present

## 2023-05-10 LAB — COMPREHENSIVE METABOLIC PANEL WITH GFR
ALT: 14 U/L (ref 0–44)
AST: 19 U/L (ref 15–41)
Albumin: 2.7 g/dL — ABNORMAL LOW (ref 3.5–5.0)
Alkaline Phosphatase: 183 U/L — ABNORMAL HIGH (ref 38–126)
Anion gap: 11 (ref 5–15)
BUN: 7 mg/dL (ref 6–20)
CO2: 20 mmol/L — ABNORMAL LOW (ref 22–32)
Calcium: 9.1 mg/dL (ref 8.9–10.3)
Chloride: 104 mmol/L (ref 98–111)
Creatinine, Ser: 0.48 mg/dL (ref 0.44–1.00)
GFR, Estimated: >60 mL/min (ref >60)
Glucose, Bld: 78 mg/dL (ref 70–99)
Potassium: 3.8 mmol/L (ref 3.5–5.1)
Sodium: 135 mmol/L (ref 135–145)
Total Bilirubin: 0.4 mg/dL (ref 0.0–1.2)
Total Protein: 7 g/dL (ref 6.5–8.1)

## 2023-05-10 LAB — CBC
HCT: 37.5 % (ref 36.0–46.0)
HCT: 34.4 % — ABNORMAL LOW (ref 36.0–46.0)
Hemoglobin: 11.5 g/dL — ABNORMAL LOW (ref 12.0–15.0)
Hemoglobin: 10.7 g/dL — ABNORMAL LOW (ref 12.0–15.0)
MCH: 22.6 pg — ABNORMAL LOW (ref 26.0–34.0)
MCH: 22.6 pg — ABNORMAL LOW (ref 26.0–34.0)
MCHC: 30.7 g/dL (ref 30.0–36.0)
MCHC: 31.1 g/dL (ref 30.0–36.0)
MCV: 73.7 fL — ABNORMAL LOW (ref 80.0–100.0)
MCV: 72.6 fL — ABNORMAL LOW (ref 80.0–100.0)
Platelets: 373 10*3/uL (ref 150–400)
Platelets: 382 10*3/uL (ref 150–400)
RBC: 5.09 MIL/uL (ref 3.87–5.11)
RBC: 4.74 MIL/uL (ref 3.87–5.11)
RDW: 15 % (ref 11.5–15.5)
RDW: 14.8 % (ref 11.5–15.5)
WBC: 10.3 10*3/uL (ref 4.0–10.5)
WBC: 10.4 10*3/uL (ref 4.0–10.5)
nRBC: 0 % (ref 0.0–0.2)
nRBC: 0 % (ref 0.0–0.2)

## 2023-05-10 LAB — PROTEIN / CREATININE RATIO, URINE
Creatinine, Urine: 14 mg/dL
Total Protein, Urine: <6 mg/dL

## 2023-05-10 LAB — TYPE AND SCREEN
ABO/RH(D): O POS
Antibody Screen: NEGATIVE

## 2023-05-10 LAB — RPR: RPR Ser Ql: NONREACTIVE

## 2023-05-10 SURGERY — Surgical Case
Anesthesia: Regional

## 2023-05-10 MED ORDER — POVIDONE-IODINE 10 % EX SWAB
2.0000 | Freq: Once | CUTANEOUS | Status: AC
  Administered 2023-05-10: 2 via TOPICAL

## 2023-05-10 MED ORDER — ACETAMINOPHEN 325 MG PO TABS: 650.0000 mg | ORAL_TABLET | ORAL | Status: DC | PRN

## 2023-05-10 MED ORDER — DIPHENHYDRAMINE HCL 50 MG/ML IJ SOLN: 12.5000 mg | INTRAMUSCULAR | Status: DC | PRN

## 2023-05-10 MED ORDER — OXYTOCIN-SODIUM CHLORIDE 30-0.9 UT/500ML-% IV SOLN
2.5000 [IU]/h | INTRAVENOUS | Status: DC
  Filled 2023-05-10: qty 500

## 2023-05-10 MED ORDER — TERBUTALINE SULFATE 1 MG/ML IJ SOLN
0.2500 mg | Freq: Once | INTRAMUSCULAR | Status: AC
Start: 2023-05-10 — End: 2023-05-10
  Administered 2023-05-10: 0.25 mg via SUBCUTANEOUS

## 2023-05-10 MED ORDER — TERBUTALINE SULFATE 1 MG/ML IJ SOLN
INTRAMUSCULAR | Status: AC
  Filled 2023-05-10: qty 1

## 2023-05-10 MED ORDER — LACTATED RINGERS IV SOLN: 500.0000 mL | INTRAVENOUS | Status: DC | PRN

## 2023-05-10 MED ORDER — LIDOCAINE HCL (PF) 1 % IJ SOLN: 30.0000 mL | INTRAMUSCULAR | Status: DC | PRN

## 2023-05-10 MED ORDER — PHENYLEPHRINE 80 MCG/ML (10ML) SYRINGE FOR IV PUSH (FOR BLOOD PRESSURE SUPPORT): 80.0000 ug | PREFILLED_SYRINGE | INTRAVENOUS | Status: DC | PRN

## 2023-05-10 MED ORDER — OXYCODONE-ACETAMINOPHEN 5-325 MG PO TABS: 1.0000 | ORAL_TABLET | ORAL | Status: DC | PRN

## 2023-05-10 MED ORDER — FENTANYL-BUPIVACAINE-NACL 0.5-0.125-0.9 MG/250ML-% EP SOLN
12.0000 mL/h | EPIDURAL | Status: DC | PRN
  Administered 2023-05-11: 12 mL/h via EPIDURAL
  Filled 2023-05-10: qty 250

## 2023-05-10 MED ORDER — EPHEDRINE 5 MG/ML INJ: 10.0000 mg | INTRAVENOUS | Status: DC | PRN

## 2023-05-10 MED ORDER — TERBUTALINE SULFATE 1 MG/ML IJ SOLN: 0.2500 mg | Freq: Once | INTRAMUSCULAR | Status: DC | PRN

## 2023-05-10 MED ORDER — LACTATED RINGERS IV SOLN: 500.0000 mL | Freq: Once | INTRAVENOUS | Status: DC

## 2023-05-10 MED ORDER — OXYTOCIN BOLUS FROM INFUSION
333.0000 mL | Freq: Once | INTRAVENOUS | Status: AC
  Administered 2023-05-11: 333 mL via INTRAVENOUS

## 2023-05-10 MED ORDER — ONDANSETRON HCL 4 MG/2ML IJ SOLN: 4.0000 mg | Freq: Four times a day (QID) | INTRAMUSCULAR | Status: DC | PRN

## 2023-05-10 MED ORDER — OXYCODONE-ACETAMINOPHEN 5-325 MG PO TABS: 2.0000 | ORAL_TABLET | ORAL | Status: DC | PRN

## 2023-05-10 MED ORDER — LACTATED RINGERS IV SOLN: INTRAVENOUS | Status: DC

## 2023-05-10 MED ORDER — MISOPROSTOL 25 MCG QUARTER TABLET
25.0000 ug | ORAL_TABLET | Freq: Once | ORAL | Status: AC
  Administered 2023-05-10: 25 ug via VAGINAL
  Filled 2023-05-10: qty 1

## 2023-05-10 MED ORDER — MISOPROSTOL 50MCG HALF TABLET
50.0000 ug | ORAL_TABLET | Freq: Once | ORAL | Status: AC
  Administered 2023-05-10: 50 ug via ORAL
  Filled 2023-05-10: qty 1

## 2023-05-10 MED ORDER — SOD CITRATE-CITRIC ACID 500-334 MG/5ML PO SOLN: 30.0000 mL | ORAL | Status: DC | PRN

## 2023-05-10 NOTE — Anesthesia Procedure Notes (Incomplete)
 Epidural Patient location during procedure: OB Start time: 05/10/2023 11:52 PM  Staffing Anesthesiologist: Rosalita Combe, MD Performed: anesthesiologist   Preanesthetic Checklist Completed: patient identified, IV checked, site marked, risks and benefits discussed, surgical consent, monitors and equipment checked, pre-op evaluation and timeout performed  Epidural Patient position: sitting Prep: DuraPrep and site prepped and draped Patient monitoring: continuous pulse ox and blood pressure Approach: midline Injection technique: LOR air  Needle:  Needle type: Tuohy  Needle gauge: 17 G Needle length: 9 cm and 9 Needle insertion depth: 5 cm cm Catheter type: closed end flexible Catheter size: 19 Gauge Catheter at skin depth: 10 cm Test dose: negative  Assessment Events: blood not aspirated, no cerebrospinal fluid, injection not painful, no injection resistance, no paresthesia and negative IV test  Additional Notes Patient identified. Risks/Benefits/Options discussed with patient including but not limited to bleeding, infection, nerve damage, paralysis, failed block, incomplete pain control, headache, blood pressure changes, nausea, vomiting, reactions to medication both or allergic, itching and postpartum back pain. Confirmed with bedside nurse the patient's most recent platelet count. Confirmed with patient that they are not currently taking any anticoagulation, have any bleeding history or any family history of bleeding disorders. Patient expressed understanding and wished to proceed. All questions were answered. Sterile technique was used throughout the entire procedure. Please see nursing notes for vital signs. Test dose was given through epidural needle and negative prior to continuing to dose epidural or start infusion. Warning signs of high block given to the patient including shortness of breath, tingling/numbness in hands, complete motor block, or any concerning symptoms with  instructions to call for help. Patient was given instructions on fall risk and not to get out of bed. All questions and concerns addressed with instructions to call with any issues.  Attempt (S) . Patient tolerated procedure well.

## 2023-05-10 NOTE — Progress Notes (Signed)
 LABOR PROGRESS NOTE  Patient Name: Amy Barrera, female   DOB: 2000/10/22, 22 y.o.  MRN: [id number]  CVE 5/60/-2  Baby well applied and mom agreeable to AROM after counseling.  Slow leak with amni-hook achieved with copious clear fluid removed.  Mom and baby tolerated well.   Augustin JAYSON Slade, MD

## 2023-05-10 NOTE — Progress Notes (Signed)
 LABOR PROGRESS NOTE  Patient Name: Amy Barrera, female   DOB: [birth year], 22 y.o.  MRN: [id number]  CVE 5.5/80/-1  Pt having what looks to be recurrent variable decels but hard to track contractions.  Offered IUPC, pt politely declines and ask to hold off unless the decels become more frequent / more severe or if no change at next check.  Pt also offered pitocin  in the event that contractions space beyond and pt politely declines, asking to wait and see unless she makes no change at her next check.    Augustin JAYSON Slade, MD

## 2023-05-10 NOTE — Progress Notes (Signed)
 [redacted]w[redacted]d Estimated Date of Delivery: 05/28/23 G2P1001 followed for [facility] with gHTN diagnosed 04/30/23 also found to be non vertex Desires ECV and IOL if successful, pLTCS if unsuccessful  Sonogram revealed oblique lie vertex in RUQ breech LLQ  Terbutaline  given Permit signed  1 attempt and easily converted to vertex Confirmed with sonogram Belly band place  Cervix 2.5/th/-3  Avoid foley bulb Discussed with Roscoe Compton CNM and she will do cytotec  and walk follwed by pitocin   Wendelyn Halter, MD 05/10/2023 9:24 AM

## 2023-05-10 NOTE — Anesthesia Procedure Notes (Signed)
 Epidural Patient location during procedure: OB Start time: 05/10/2023 11:52 PM End time: 05/11/2023 12:04 AM  Staffing Anesthesiologist: Rosalita Combe, MD Performed: anesthesiologist   Preanesthetic Checklist Completed: patient identified, IV checked, site marked, risks and benefits discussed, surgical consent, monitors and equipment checked, pre-op evaluation and timeout performed  Epidural Patient position: sitting Prep: DuraPrep and site prepped and draped Patient monitoring: continuous pulse ox and blood pressure Approach: midline Location: L3-L4 Injection technique: LOR air  Needle:  Needle type: Tuohy  Needle gauge: 17 G Needle length: 9 cm and 9 Needle insertion depth: 7 cm Catheter type: closed end flexible Catheter size: 19 Gauge Catheter at skin depth: 13 cm Test dose: negative  Assessment Events: blood not aspirated, no cerebrospinal fluid, injection not painful, no injection resistance, no paresthesia and negative IV test  Additional Notes Patient identified. Risks/Benefits/Options discussed with patient including but not limited to bleeding, infection, nerve damage, paralysis, failed block, incomplete pain control, headache, blood pressure changes, nausea, vomiting, reactions to medication both or allergic, itching and postpartum back pain. Confirmed with bedside nurse the patient's most recent platelet count. Confirmed with patient that they are not currently taking any anticoagulation, have any bleeding history or any family history of bleeding disorders. Patient expressed understanding and wished to proceed. All questions were answered. Sterile technique was used throughout the entire procedure. Please see nursing notes for vital signs. Test dose was given through epidural needle and negative prior to continuing to dose epidural or start infusion. Warning signs of high block given to the patient including shortness of breath, tingling/numbness in hands, complete  motor block, or any concerning symptoms with instructions to call for help. Patient was given instructions on fall risk and not to get out of bed. All questions and concerns addressed with instructions to call with any issues. 1 Attempt (S) . Patient tolerated procedure well.

## 2023-05-10 NOTE — H&P (Signed)
 OBSTETRIC ADMISSION HISTORY AND PHYSICAL  Amy Barrera is a 23 y.o. female G2P1001 with IUP at [redacted]w[redacted]d by LMP presenting for IOL gHTN following successful version. She reports +FMs, No LOF, no VB, no blurry vision, headaches or peripheral edema, and RUQ pain.  She plans on breast feeding. She request Phexxi for birth control. She received her prenatal [facility] at [facility]   Dating: By LMP --->  Estimated Date of Delivery: 05/28/23  Sono:    @[redacted]w[redacted]d , CWD, normal anatomy, cephalic presentation, anterior fundal lie, 3469g, 96% EFW   Prenatal History/Complications:  Patient Active Problem List   Diagnosis Date Noted   Breech presentation, no version 05/10/2023   Pregnancy 05/10/2023   Inappropriate sinus tachycardia (HCC) 12/19/2022   Encounter for supervision of normal pregnancy, antepartum 12/05/2022   Tachycardia 12/05/2022   History of gestational hypertension 10/16/2022   Postpartum hemorrhage 07/27/2021   Alpha thalassemia silent carrier 03/31/2021   Bony abnormality 10/06/2014   Primary familial hypertrophic cardiomyopathy (HCC) 10/05/2013   NURSING  PROVIDER  Office Location [facility] Dating by LMP c/w U/S at 6 wks  [facility] Model Traditional Anatomy U/S Normal female '[place]'  Initiated care at  [employer]                 Language  English               LAB RESULTS   Support Person   Genetics NIPS: LR female AFP:       NT/IT (FT only) Too late      Carrier Screen Horizon: 12/22 +silent carrier alpha thal  Rhogam  O/Positive/-- (09/24 0000) A1C/GTT Early:  Third trimester: wnl  Flu Vaccine Got Aug/Sep @ work      TDaP Vaccine  2/25 Blood Type O/Positive/-- (09/24 0000)  Covid Vaccine   Antibody Negative (09/24 0000)  RSV Vaccine   Rubella Immune (09/24 0000)  Feeding Plan breast RPR Nonreactive (09/24 0000)  Contraception Phexxi HBsAg Negative (09/24 0000)  Circumcision no HIV Non-reactive (09/24 0000)  Pediatrician  [facility] HCVAb Negative (09/24 0000)  Prenatal Classes  discussed          Pap <21yo  BTL Consent   GC/CT Initial:  -/- 36wks:    VBAC Consent   GBS For PCN allergy, check sensitivities            DME Rx [x ] BP cuff [ ]  Weight Scale Waterbirth  [ ]  Class [ ]  Consent [ ]  CNM visit  PHQ9 & GAD7 [ x ] new OB [  ] 28 weeks  [  ] 36 weeks Induction  [ ]  Orders Entered [ ] Foley Y/N     Past Medical History: Past Medical History:  Diagnosis Date   ADHD    Anxiety    Bipolar disorder (HCC)    Depression    History of gestational hypertension    Irritability and anger 11/30/2015   Self-mutilation    SVT (supraventricular tachycardia) (HCC)     Past Surgical History: Past Surgical History:  Procedure Laterality Date   NO PAST SURGERIES      Obstetrical History: OB History     Gravida  2   Para  1   Term  1   Preterm  0   AB  0   Living  1      SAB  0   IAB  0   Ectopic  0   Multiple  0   Live Births  1           Social History Social History   Socioeconomic History   Marital status: Significant Other    Spouse name: Not on file   Number of children: Not on file   Years of education: Not on file   Highest education level: High school graduate  Occupational History   Occupation: unemployed  Tobacco Use   Smoking status: Former    Current packs/day: 0.50    Types: Cigarettes    Passive exposure: Never   Smokeless tobacco: Never  Vaping Use   Vaping status: Never Used  Substance and Sexual Activity   Alcohol use: Not Currently    Comment: occ   Drug use: Not Currently    Types: Marijuana, Oxycodone     Comment: tramadol, lyrica, oxy from 2 dealers sometimes   Sexual activity: Yes    Birth control/protection: None  Other Topics Concern   Not on file  Social History Narrative   Lives with grandmother       Social Drivers of Health   Financial Resource Strain: Low Risk  (12/05/2022)   Overall Financial Resource Strain (CARDIA)    Difficulty of Paying Living Expenses: Not very hard   Food Insecurity: No Food Insecurity (05/10/2023)   Hunger Vital Sign    Worried About Running Out of Food in the Last Year: Never true    Ran Out of Food in the Last Year: Never true  Transportation Needs: No Transportation Needs (05/10/2023)   PRAPARE - Administrator, Civil Service (Medical): No    Lack of Transportation (Non-Medical): No  Physical Activity: Insufficiently Active (12/05/2022)   Exercise Vital Sign    Days of Exercise per Week: 3 days    Minutes of Exercise per Session: 30 min  Stress: No Stress Concern Present (12/05/2022)   [employer] of Occupational Health - Occupational Stress Questionnaire    Feeling of Stress : Not at all  Social Connections: Unknown (05/10/2023)   Social Connection and Isolation Panel [NHANES]    Frequency of Communication with Friends and Family: Patient unable to answer    Frequency of Social Gatherings with Friends and Family: Patient unable to answer    Attends Religious Services: Patient unable to answer    Active Member of Clubs or Organizations: Patient unable to answer    Attends Banker Meetings: Patient unable to answer    Marital Status: Living with partner    Family History: Family History  Adopted: Yes  Problem Relation Age of Onset   Drug abuse Mother    Depression Mother    Bipolar disorder Mother    Drug abuse Father    Alcohol abuse Father    Early death Father        overdose @ 36   Heart disease Maternal Grandmother    Arthritis Paternal Grandmother    Depression Paternal Grandmother     Allergies: No Known Allergies  Medications Prior to Admission  Medication Sig Dispense Refill Last Dose/Taking   aspirin  81 MG chewable tablet Chew 2 tablets (162 mg total) by mouth daily. (Patient taking differently: Chew 81 mg by mouth 2 (two) times daily.) 60 tablet 7 Past Week   cyclobenzaprine  (FLEXERIL ) 10 MG tablet Take 1 tablet (10 mg total) by mouth 3 (three) times daily as needed for  muscle spasms. 30 tablet 0 Past Week   omeprazole (PRILOSEC) 20 MG capsule Take 20 mg by mouth daily.   05/09/2023  Prenatal Vit-Fe Fumarate-FA (PRENATAL VITAMIN PO) Take 1 tablet by mouth daily.   Past Month   acetaminophen  (TYLENOL ) 325 MG tablet Take 950 mg by mouth every 6 (six) hours as needed.      clindamycin  (CLINDAGEL) 1 % gel Apply topically 2 (two) times daily as needed (acne). 30 g 6      Review of Systems   All systems reviewed and negative except as stated in HPI  Blood pressure 125/85, pulse (!) 129, temperature 98.2 F (36.8 C), temperature source Oral, resp. rate 16, height 5' 8 (1.727 m), weight 99.5 kg, last menstrual period 08/21/2022, SpO2 99%, not currently breastfeeding. General appearance: alert, cooperative, and appears stated age Lungs: clear to auscultation bilaterally Heart: regular rate and rhythm Abdomen: soft, non-tender; bowel sounds normal Pelvic: normal female genitalia  Extremities: Homans sign is negative, no sign of DVT Presentation: cephalic Fetal monitoringBaseline: 145 bpm, Variability: Good {> 6 bpm), Accelerations: Reactive, and Decelerations: Absent Uterine activityNone Dilation: 4 Effacement (%): 50 Station: -3 Exam by:: lee   Prenatal labs: ABO, Rh: --/--/O POS (04/18 9187) Antibody: NEG (04/18 0812) Rubella: Immune (09/24 0000) RPR: NON REACTIVE (04/18 0832)  HBsAg: Negative (09/24 0000)  HIV: Non Reactive (02/11 0834)  GBS: Negative/-- (04/08 1435)    Lab Results  Component Value Date   GBS Negative 04/30/2023   GTT wnl Genetic screening  LR female Anatomy US  normal  Immunization History  Administered Date(s) Administered   DTaP 08/21/2002, 10/22/2002, 12/22/2002, 12/29/2003, 06/07/2006   HIB (PRP-OMP) 08/21/2002, 10/22/2002, 12/22/2002, 09/23/2003, 12/29/2003   Hepatitis A 12/18/2004, 07/03/2005   Hepatitis B Sep 01, 2002, 08/21/2002, 12/22/2002   IPV 08/21/2002, 10/22/2002, 09/23/2003, 06/07/2006   Influenza,inj,Quad  PF,6+ Mos 12/15/2012, 10/28/2015   Influenza-Unspecified 12/29/2003, 02/07/2004, 12/18/2004, 11/26/2005, 11/14/2006, 11/18/2007, 10/06/2008, 10/12/2009, 10/05/2010, 11/29/2011   MMR 06/03/2003, 06/07/2006   Meningococcal Conjugate 10/12/2014   Pneumococcal Conjugate-13 08/21/2002, 10/22/2002, 12/22/2002, 09/23/2003   Pneumococcal-Unspecified 08/21/2002, 10/22/2002, 12/22/2002, 09/23/2003, 10/06/2008, 10/06/2008   Td 10/12/2014   Tdap 10/12/2014, 10/29/2019, 05/17/2021, 03/19/2023   Varicella 06/03/2003, 06/07/2006    Prenatal Transfer Tool  Maternal Diabetes: No Genetic Screening: Normal Maternal Ultrasounds/Referrals: Normal Fetal Ultrasounds or other Referrals:  None Maternal Substance Abuse:  No Significant Maternal Medications:  None Significant Maternal Lab Results: Group B Strep negative Number of Prenatal Visits:greater than 3 verified prenatal visits Maternal Vaccinations:TDap Other Comments:  None   Results for orders placed or performed during the hospital encounter of 05/10/23 (from the past 24 hours)  Type and screen [facility]   Collection Time: 05/10/23  8:12 AM  Result Value Ref Range   ABO/RH(D) O POS    Antibody Screen NEG    Sample Expiration      05/13/2023,2359 Performed at [facility], [address], [place], [place] [address]   CBC   Collection Time: 05/10/23  8:32 AM  Result Value Ref Range   WBC 10.3 4.0 - 10.5 K/uL   RBC 5.09 3.87 - 5.11 MIL/uL   Hemoglobin 11.5 (L) 12.0 - 15.0 g/dL   HCT 62.4 63.9 - 53.9 %   MCV 73.7 (L) 80.0 - 100.0 fL   MCH 22.6 (L) 26.0 - 34.0 pg   MCHC 30.7 30.0 - 36.0 g/dL   RDW 84.9 88.4 - 84.4 %   Platelets 373 150 - 400 K/uL   nRBC 0.0 0.0 - 0.2 %  RPR   Collection Time: 05/10/23  8:32 AM  Result Value Ref Range   RPR Ser Ql  NON REACTIVE NON REACTIVE  Comprehensive metabolic panel   Collection Time: 05/10/23  8:37 AM  Result Value Ref Range   Sodium 135 135 - 145 mmol/L   Potassium 3.8  3.5 - 5.1 mmol/L   Chloride 104 98 - 111 mmol/L   CO2 20 (L) 22 - 32 mmol/L   Glucose, Bld 78 70 - 99 mg/dL   BUN 7 6 - 20 mg/dL   Creatinine, Ser 9.51 0.44 - 1.00 mg/dL   Calcium 9.1 8.9 - 89.6 mg/dL   Total Protein 7.0 6.5 - 8.1 g/dL   Albumin 2.7 (L) 3.5 - 5.0 g/dL   AST 19 15 - 41 U/L   ALT 14 0 - 44 U/L   Alkaline Phosphatase 183 (H) 38 - 126 U/L   Total Bilirubin 0.4 0.0 - 1.2 mg/dL   GFR, Estimated >39 >39 mL/min   Anion gap 11 5 - 15    Patient Active Problem List   Diagnosis Date Noted   Breech presentation, no version 05/10/2023   Pregnancy 05/10/2023   Inappropriate sinus tachycardia (HCC) 12/19/2022   Encounter for supervision of normal pregnancy, antepartum 12/05/2022   Tachycardia 12/05/2022   History of gestational hypertension 10/16/2022   Postpartum hemorrhage 07/27/2021   Alpha thalassemia silent carrier 03/31/2021   Bony abnormality 10/06/2014   Primary familial hypertrophic cardiomyopathy (HCC) 10/05/2013    Assessment/Plan:  Amy Barrera is a 23 y.o. G2P1001 at [redacted]w[redacted]d here for IOL following successful version.   #Labor:S/p Dual cytotec  will Pit vs AROM.  #Pain: Per patient request #FWB: Cat 1 #GBS status:  negative #Feeding: Breastmilk  #Reproductive Life planning:  Phexxi  #Circ:  no  #Sinus Tachycardia hx: asymptomatic, CTM  #Hx of PPH - post delivery uterotonic, ppx TXA  #Hx of gHTN: Plts 373, LFTs 19/14 - f/u baseline UPC  #Alpha thal silent carrier  Augustin JAYSON Slade, MD  05/10/2023, 1:43 PM

## 2023-05-10 NOTE — Anesthesia Preprocedure Evaluation (Addendum)
 Anesthesia Evaluation  Patient identified by MRN, date of birth, ID band Patient awake    Reviewed: Allergy & Precautions, NPO status , Patient's Chart, lab work & pertinent test results  Airway Mallampati: II  TM Distance: >3 FB Neck ROM: Full    Dental no notable dental hx. (+) Teeth Intact, Dental Advisory Given   Pulmonary former smoker   Pulmonary exam normal breath sounds clear to auscultation       Cardiovascular hypertension (gHtn), Normal cardiovascular exam+ dysrhythmias Supra Ventricular Tachycardia  Rhythm:Regular Rate:Normal  Family Hx of Hypertropic cardiomyopathy   2023 Echo  1. Left ventricular ejection fraction, by estimation, is 60 to 65%. The  left ventricle has normal function. The left ventricle has no regional  wall motion abnormalities. Left ventricular diastolic parameters were  normal.   2. Right ventricular systolic function is normal. The right ventricular  size is normal. Tricuspid regurgitation signal is inadequate for assessing  PA pressure.   3. A small pericardial effusion is present. The pericardial effusion is  at the apex with trivial collection posteriorly.   4. The mitral valve is grossly normal. Trivial mitral valve  regurgitation.   5. The aortic valve is tricuspid. Lambl's excrescences noted (normal  variant). Aortic valve regurgitation is not visualized.   6. The inferior vena cava is dilated in size with >50% respiratory  variability, suggesting right atrial pressure of 8 mmHg.     Neuro/Psych  PSYCHIATRIC DISORDERS Anxiety Depression Bipolar Disorder      GI/Hepatic ,,,(+)     substance abuse (none recently)    Endo/Other    Renal/GU      Musculoskeletal   Abdominal   Peds  Hematology Lab Results      Component                Value               Date                      WBC                      10.4                05/10/2023                HGB                       10.7 (L)            05/10/2023                HCT                      34.4 (L)            05/10/2023                MCV                      72.6 (L)            05/10/2023                PLT                      382                 05/10/2023  Anesthesia Other Findings   Reproductive/Obstetrics (+) Pregnancy                             Anesthesia Physical Anesthesia Plan  ASA: 3  Anesthesia Plan: Epidural   Post-op Pain Management:    Induction:   PONV Risk Score and Plan:   Airway Management Planned:   Additional Equipment:   Intra-op Plan:   Post-operative Plan:   Informed Consent: I have reviewed the patients History and Physical, chart, labs and discussed the procedure including the risks, benefits and alternatives for the proposed anesthesia with the patient or authorized representative who has indicated his/her understanding and acceptance.       Plan Discussed with:   Anesthesia Plan Comments: (37.3 G2P1 w gHtn for LEA)       Anesthesia Quick Evaluation

## 2023-05-11 ENCOUNTER — Encounter (HOSPITAL_COMMUNITY): Payer: Self-pay | Admitting: Obstetrics & Gynecology

## 2023-05-11 DIAGNOSIS — Z3A37 37 weeks gestation of pregnancy: Secondary | ICD-10-CM | POA: Diagnosis not present

## 2023-05-11 DIAGNOSIS — O134 Gestational [pregnancy-induced] hypertension without significant proteinuria, complicating childbirth: Secondary | ICD-10-CM | POA: Diagnosis not present

## 2023-05-11 LAB — CBC
HCT: 32.2 % — ABNORMAL LOW (ref 36.0–46.0)
Hemoglobin: 10 g/dL — ABNORMAL LOW (ref 12.0–15.0)
MCH: 22.7 pg — ABNORMAL LOW (ref 26.0–34.0)
MCHC: 31.1 g/dL (ref 30.0–36.0)
MCV: 73 fL — ABNORMAL LOW (ref 80.0–100.0)
Platelets: 343 10*3/uL (ref 150–400)
RBC: 4.41 MIL/uL (ref 3.87–5.11)
RDW: 14.9 % (ref 11.5–15.5)
WBC: 11.7 10*3/uL — ABNORMAL HIGH (ref 4.0–10.5)
nRBC: 0 % (ref 0.0–0.2)

## 2023-05-11 MED ORDER — ZOLPIDEM TARTRATE 5 MG PO TABS: 5.0000 mg | ORAL_TABLET | Freq: Every evening | ORAL | Status: DC | PRN

## 2023-05-11 MED ORDER — SIMETHICONE 80 MG PO CHEW: 80.0000 mg | CHEWABLE_TABLET | ORAL | Status: DC | PRN

## 2023-05-11 MED ORDER — BENZOCAINE-MENTHOL 20-0.5 % EX AERO
1.0000 | INHALATION_SPRAY | CUTANEOUS | Status: DC | PRN
  Administered 2023-05-11: 1 via TOPICAL
  Filled 2023-05-11 (×2): qty 56

## 2023-05-11 MED ORDER — PRENATAL MULTIVITAMIN CH
1.0000 | ORAL_TABLET | Freq: Every day | ORAL | Status: DC
  Administered 2023-05-11 – 2023-05-12 (×2): 1 via ORAL
  Filled 2023-05-11 (×2): qty 1

## 2023-05-11 MED ORDER — SODIUM CHLORIDE 0.9% FLUSH: 3.0000 mL | INTRAVENOUS | Status: DC | PRN

## 2023-05-11 MED ORDER — LIDOCAINE HCL (PF) 1 % IJ SOLN
INTRAMUSCULAR | Status: DC | PRN
  Administered 2023-05-10: 5 mL via EPIDURAL

## 2023-05-11 MED ORDER — ONDANSETRON HCL 4 MG/2ML IJ SOLN: 4.0000 mg | INTRAMUSCULAR | Status: DC | PRN

## 2023-05-11 MED ORDER — OXYTOCIN-SODIUM CHLORIDE 30-0.9 UT/500ML-% IV SOLN
1.0000 m[IU]/min | INTRAVENOUS | Status: DC
  Administered 2023-05-11: 2 m[IU]/min via INTRAVENOUS

## 2023-05-11 MED ORDER — ONDANSETRON HCL 4 MG PO TABS: 4.0000 mg | ORAL_TABLET | ORAL | Status: DC | PRN

## 2023-05-11 MED ORDER — TETANUS-DIPHTH-ACELL PERTUSSIS 5-2.5-18.5 LF-MCG/0.5 IM SUSY: 0.5000 mL | PREFILLED_SYRINGE | Freq: Once | INTRAMUSCULAR | Status: DC

## 2023-05-11 MED ORDER — IBUPROFEN 600 MG PO TABS
600.0000 mg | ORAL_TABLET | Freq: Four times a day (QID) | ORAL | Status: DC
  Administered 2023-05-11 – 2023-05-12 (×5): 600 mg via ORAL
  Filled 2023-05-11 (×5): qty 1

## 2023-05-11 MED ORDER — DIPHENHYDRAMINE HCL 25 MG PO CAPS: 25.0000 mg | ORAL_CAPSULE | Freq: Four times a day (QID) | ORAL | Status: DC | PRN

## 2023-05-11 MED ORDER — SODIUM CHLORIDE 0.9 % IV SOLN: 250.0000 mL | INTRAVENOUS | Status: DC | PRN

## 2023-05-11 MED ORDER — SENNOSIDES-DOCUSATE SODIUM 8.6-50 MG PO TABS
2.0000 | ORAL_TABLET | ORAL | Status: DC
  Administered 2023-05-11 – 2023-05-12 (×2): 2 via ORAL
  Filled 2023-05-11 (×2): qty 2

## 2023-05-11 MED ORDER — COCONUT OIL OIL: 1.0000 | TOPICAL_OIL | Status: DC | PRN

## 2023-05-11 MED ORDER — TERBUTALINE SULFATE 1 MG/ML IJ SOLN: 0.2500 mg | Freq: Once | INTRAMUSCULAR | Status: DC | PRN

## 2023-05-11 MED ORDER — ACETAMINOPHEN 325 MG PO TABS
650.0000 mg | ORAL_TABLET | ORAL | Status: DC | PRN
  Administered 2023-05-11: 650 mg via ORAL
  Filled 2023-05-11: qty 2

## 2023-05-11 MED ORDER — SODIUM CHLORIDE 0.9% FLUSH: 3.0000 mL | Freq: Two times a day (BID) | INTRAVENOUS | Status: DC

## 2023-05-11 MED ORDER — OXYCODONE HCL 5 MG PO TABS: 5.0000 mg | ORAL_TABLET | ORAL | Status: DC | PRN

## 2023-05-11 MED ORDER — DIBUCAINE (PERIANAL) 1 % EX OINT: 1.0000 | TOPICAL_OINTMENT | CUTANEOUS | Status: DC | PRN

## 2023-05-11 MED ORDER — MEASLES, MUMPS & RUBELLA VAC IJ SOLR: 0.5000 mL | Freq: Once | INTRAMUSCULAR | Status: DC

## 2023-05-11 MED ORDER — WITCH HAZEL-GLYCERIN EX PADS: 1.0000 | MEDICATED_PAD | CUTANEOUS | Status: DC | PRN

## 2023-05-11 NOTE — Lactation Note (Signed)
 This note was copied from a baby's chart. Lactation Consultation Note  Patient Name: Boy Chonte Ricke WNUUV'[id number] Date: 05/11/2023 Age:21 hours Reason for consult: Follow-up assessment;Early term 38-38.6wks, Mother's request.  Returned to room per mother's request. Her breasts are filling and baby has been sleepy. Suggest setting up DEBP. Discussed pumping for 15 min if baby is sleepy at the breast and pumping for 5 min only to relieve discomfort. Mother was fitted with 18 mm flanges but discussed the option of 21 mm if needed. Mother will pump as needed.  If baby is not latching or is having short feeding, she can pump q 3 hours for 15 min. Suggest calling for help as needed. Maternal Data Has patient been taught Hand Expression?: Yes  Feeding Mother's Current Feeding Choice: Breast Milk  Lactation Tools Discussed/Used Tools: Pump;Flanges Flange Size: 18 Breast pump type: Double-Electric Breast Pump;Manual Pump Education: Setup, frequency, and cleaning;Milk Storage Reason for Pumping: stimulation and supplementation Pumping frequency: q 3 hours for 15 min  Interventions Interventions: Education;DEBP;Hand pump  Consult Status Consult Status: Follow-up Date: 05/12/23 Follow-up type: In-patient   Luellen Sages  RN, IBCLC 05/11/2023, 12:22 PM

## 2023-05-11 NOTE — Lactation Note (Signed)
 This note was copied from a baby's chart. Lactation Consultation Note  Patient Name: Boy Ahliya Glatt [id number] Date: 05/11/2023 Age:21 hours Reason for consult: Initial assessment;Early term 37-38.6wks  P2, Baby [redacted]w[redacted]d.  Mother had pumped during pregnancy with her hands free Willow pump and has expressed up to 30 ml per session.  She has a history of overproduction. Mother states she used 17 mm and 19 mm flange inserts on her pump.  Family brought from home frozen syringes with 1ml - 30 ml. Discussed putting syringe needles in sharp containers and transferring breastmilk to colostrum containers. Breastmilk is starting to thaw (on ice in cooler).  Reviewed breatsmilk storage and using breastmilk within 24 hours.  Suggest supplementing after breastfeeding sessions or during cluster feeding at night. Family states they have more frozen milk at home.  Discussed early term feeding behavior and suggest calling for assistance today as needed.  Recommend breastfeeding first and supplementing after as desired.  Provided spoon,syringe, soap and Nfant nipple for options to give additional breastmilk to baby.     Maternal Data Has patient been taught Hand Expression?: Yes Does the patient have breastfeeding experience prior to this delivery?: Yes How long did the patient breastfeed?: 5  mos.  Feeding Mother's Current Feeding Choice: Breast Milk   Interventions Interventions: Breast feeding basics reviewed;Education;CDC milk storage guidelines;LC Services brochure  Discharge Pump: Personal;Hands Free;DEBP  Consult Status Consult Status: Follow-up Date: 05/12/23 Follow-up type: In-patient    Vicenta Graft Boschen  RN, IBCLC 05/11/2023, 8:15 AM

## 2023-05-11 NOTE — Progress Notes (Signed)
 Labor Progress Note Amy Barrera is a 23 y.o. G2P1001 at [redacted]w[redacted]d presented for admitted for IOL for gHTN after ECV S: Comfortable with epidural. Ctx spaced to >5 mins, agreeable to start pitocin  augmentation.   O:  BP (!) 108/55   Pulse (!) 114   Temp 97.8 F (36.6 C) (Oral)   Resp 16   Ht 5\' 8"  (1.727 m)   Wt 99.5 kg   LMP 08/21/2022   SpO2 98%   BMI 33.36 kg/m   EFM: baseline 125, accels, no decels, moderate variability TOCO: q4-62min contractions  CVE: Dilation: 6 Effacement (%): 80 Cervical Position: Anterior Station: -1 Presentation: Vertex Exam by:: Yanna Marougas,RN AROM @1620  on 4/18  A&P: 23 y.o. G2P1001 [redacted]w[redacted]d admitted for IOL #Labor: Progressing slowly. Will augment with pitocin .  #Pain: Epidural #FWB: Cat I #GBS negative  #gHTN - mild range BP, asymptomatic, labs wnl  Darrow End, MD 2:33 AM

## 2023-05-11 NOTE — Anesthesia Postprocedure Evaluation (Signed)
 Anesthesia Post Note  Patient: Amy Barrera  Procedure(s) Performed: AN AD HOC LABOR EPIDURAL     Patient location during evaluation: Mother Baby Anesthesia Type: Epidural Level of consciousness: awake and alert Pain management: pain level controlled Vital Signs Assessment: post-procedure vital signs reviewed and stable Respiratory status: spontaneous breathing, nonlabored ventilation and respiratory function stable Cardiovascular status: stable Postop Assessment: no headache, no backache and epidural receding Anesthetic complications: no   No notable events documented.  Last Vitals:  Vitals:   05/11/23 0543 05/11/23 0643  BP: 129/83 114/75  Pulse: 96 100  Resp: 16 16  Temp: 36.8 C 36.7 C  SpO2: 99% 99%    Last Pain:  Vitals:   05/11/23 0643  TempSrc: Oral  PainSc:    Pain Goal:                   [name]

## 2023-05-12 MED ORDER — FAMOTIDINE 20 MG PO TABS
20.0000 mg | ORAL_TABLET | Freq: Two times a day (BID) | ORAL | Status: DC
  Administered 2023-05-12: 20 mg via ORAL
  Filled 2023-05-12 (×2): qty 1

## 2023-05-12 MED ORDER — ACETAMINOPHEN 325 MG PO TABS: 650.0000 mg | ORAL_TABLET | ORAL | 0 refills | Status: AC | PRN

## 2023-05-12 MED ORDER — POTASSIUM CHLORIDE CRYS ER 20 MEQ PO TBCR: 20.0000 meq | EXTENDED_RELEASE_TABLET | Freq: Every day | ORAL | 0 refills | Status: DC

## 2023-05-12 MED ORDER — IBUPROFEN 600 MG PO TABS: 600.0000 mg | ORAL_TABLET | Freq: Four times a day (QID) | ORAL | 0 refills | Status: AC

## 2023-05-12 MED ORDER — FUROSEMIDE 20 MG PO TABS: 20.0000 mg | ORAL_TABLET | Freq: Every day | ORAL | 0 refills | Status: DC

## 2023-05-12 MED ORDER — SENNOSIDES-DOCUSATE SODIUM 8.6-50 MG PO TABS: 2.0000 | ORAL_TABLET | Freq: Every evening | ORAL | 0 refills | Status: AC | PRN

## 2023-05-12 NOTE — Discharge Summary (Signed)
 Postpartum Discharge Summary    Patient Name: Amy Barrera DOB: [birth year] MRN: [id number]  Date of admission: 05/10/2023 Delivery date:05/11/2023 Delivering provider: LEVEQUE, ALYSSA Date of discharge: 05/12/2023  Admitting diagnosis: Pregnancy [Z34.90] Intrauterine pregnancy: [redacted]w[redacted]d     Secondary diagnosis:  Principal Problem:   Pregnancy Active Problems:   Gestational hypertension   SVD (spontaneous vaginal delivery)  Additional problems: History of sinus tachycardia    Discharge diagnosis: Term Pregnancy Delivered                                              Post partum procedures: none Augmentation: AROM, Pitocin , and Cytotec  Complications: None  Hospital course: Induction of Labor With Vaginal Delivery   23 y.o. yo [id number] at 104w4d was admitted to the hospital 05/10/2023 for ECV. ECV was successful. She was then admitted for induction of labor.  Indication for induction: Gestational hypertension.  Patient had an uncomplicated labor course Membrane Rupture Time/Date: 4:20 PM,05/10/2023  Delivery Method:Vaginal, Spontaneous Operative Delivery:N/A Episiotomy: None Lacerations:  None Details of delivery can be found in separate delivery note.  Patient had a postpartum course complicated by none. Patient is discharged home 05/12/23.  Newborn Data: Birth date:05/11/2023 Birth time:3:38 AM Gender:Female Living status:Living Apgars:8 ,9  Weight:3650 g  Magnesium  Sulfate received: No BMZ received: No Rhophylac:No MMR:N/A T-DaP:Given prenatally Flu: Yes RSV Vaccine received: No Transfusion:No  Immunizations received: Immunization History  Administered Date(s) Administered   DTaP 08/21/2002, 10/22/2002, 12/22/2002, 12/29/2003, 06/07/2006   HIB (PRP-OMP) 08/21/2002, 10/22/2002, 12/22/2002, 09/23/2003, 12/29/2003   Hepatitis A 12/18/2004, 07/03/2005   Hepatitis B 07-20-2002, 08/21/2002, 12/22/2002   IPV 08/21/2002, 10/22/2002, 09/23/2003, 06/07/2006    Influenza,inj,Quad PF,6+ Mos 12/15/2012, 10/28/2015   Influenza-Unspecified 12/29/2003, 02/07/2004, 12/18/2004, 11/26/2005, 11/14/2006, 11/18/2007, 10/06/2008, 10/12/2009, 10/05/2010, 11/29/2011   MMR 06/03/2003, 06/07/2006   Meningococcal Conjugate 10/12/2014   Pneumococcal Conjugate-13 08/21/2002, 10/22/2002, 12/22/2002, 09/23/2003   Pneumococcal-Unspecified 08/21/2002, 10/22/2002, 12/22/2002, 09/23/2003, 10/06/2008, 10/06/2008   Td 10/12/2014   Tdap 10/12/2014, 10/29/2019, 05/17/2021, 03/19/2023   Varicella 06/03/2003, 06/07/2006    Physical exam  Vitals:   05/11/23 1448 05/11/23 1847 05/11/23 2042 05/12/23 0409  BP: 115/75 118/80 117/85 114/77  Pulse: 96 92 (!) 104 96  Resp: 18 18 16 18   Temp: 98.4 F (36.9 C) 98.5 F (36.9 C) 98.1 F (36.7 C) 98.1 F (36.7 C)  TempSrc: Oral Oral Oral Oral  SpO2: 98% 99% 100% 100%  Weight:      Height:       Labs: Lab Results  Component Value Date   WBC 11.7 (H) 05/11/2023   HGB 10.0 (L) 05/11/2023   HCT 32.2 (L) 05/11/2023   MCV 73.0 (L) 05/11/2023   PLT 343 05/11/2023      Latest Ref Rng & Units 05/10/2023    8:37 AM  CMP  Glucose 70 - 99 mg/dL 78   BUN 6 - 20 mg/dL 7   Creatinine 1.47 - 8.29 mg/dL 5.62   Sodium 130 - 865 mmol/L 135   Potassium 3.5 - 5.1 mmol/L 3.8   Chloride 98 - 111 mmol/L 104   CO2 22 - 32 mmol/L 20   Calcium 8.9 - 10.3 mg/dL 9.1   Total Protein 6.5 - 8.1 g/dL 7.0   Total Bilirubin 0.0 - 1.2 mg/dL 0.4   Alkaline Phos 38 - 126 U/L 183   AST 15 - 41 U/L  19   ALT 0 - 44 U/L 14    Edinburgh Score:    05/11/2023    8:49 PM  Edinburgh Postnatal Depression Scale Screening Tool  I have been able to laugh and see the funny side of things. 0  I have looked forward with enjoyment to things. 0  I have blamed myself unnecessarily when things went wrong. 2  I have been anxious or worried for no good reason. 1  I have felt scared or panicky for no good reason. 0  Things have been getting on top of me. 0  I  have been so unhappy that I have had difficulty sleeping. 0  I have felt sad or miserable. 0  I have been so unhappy that I have been crying. 0  The thought of harming myself has occurred to me. 0  Edinburgh Postnatal Depression Scale Total 3   Edinburgh Postnatal Depression Scale Total: 3   After visit meds:  Allergies as of 05/12/2023   No Known Allergies      Medication List     STOP taking these medications    aspirin  81 MG chewable tablet   clindamycin  1 % gel Commonly known as: CLINDAGEL       TAKE these medications    acetaminophen  325 MG tablet Commonly known as: Tylenol  Take 2 tablets (650 mg total) by mouth every 4 (four) hours as needed (for pain scale < 4). What changed:  how much to take when to take this reasons to take this   cyclobenzaprine  10 MG tablet Commonly known as: FLEXERIL  Take 1 tablet (10 mg total) by mouth 3 (three) times daily as needed for muscle spasms.   furosemide  20 MG tablet Commonly known as: Lasix  Take 1 tablet (20 mg total) by mouth daily.   ibuprofen  600 MG tablet Commonly known as: ADVIL  Take 1 tablet (600 mg total) by mouth every 6 (six) hours.   omeprazole 20 MG capsule Commonly known as: PRILOSEC Take 20 mg by mouth daily.   potassium chloride  SA 20 MEQ tablet Commonly known as: KLOR-CON  M Take 1 tablet (20 mEq total) by mouth daily.   PRENATAL VITAMIN PO Take 1 tablet by mouth daily.   senna-docusate 8.6-50 MG tablet Commonly known as: Senokot-S Take 2 tablets by mouth at bedtime as needed for mild constipation.         Discharge home in stable condition Infant Feeding: Breast Infant Disposition:home with mother Discharge instruction: per After Visit Summary and Postpartum booklet. Activity: Advance as tolerated. Pelvic rest for 6 weeks.  Diet: routine diet Future Appointments:No future appointments. Follow up Visit:  Follow-up Information     [facility] Follow up in 1 week(s).   Specialty: Obstetrics and Gynecology Why: Please follow up for a blood pressure check in 1 week and routine postpartum care in 4-6 weeks Contact information: [address] [place]  [address] [phone]                Message routed by [facility] 4/20 Please schedule this patient for a In person postpartum visit in 6 weeks with the following provider: Any provider. Additional Postpartum F/U:BP check 1 week  Low risk pregnancy complicated by:  gHTN Delivery mode:  Vaginal, Spontaneous Anticipated Birth Control:   Phexxi   05/12/2023 Izell Marsh, MD

## 2023-05-12 NOTE — Progress Notes (Signed)
 POSTPARTUM PROGRESS NOTE  Post Partum Day 1  Subjective:  Amy Barrera is a 23 y.o. [id number] s/p SVD at [redacted]w[redacted]d.  She reports she is doing well. No acute events overnight. She denies any problems with ambulating, voiding or po intake. Denies nausea or vomiting.  Pain is well controlled.  Lochia is appropriate.  However, pt frustrated as sleep deprived and feels as though little assistance from usually supportive partner.   Objective: Blood pressure 114/77, pulse 96, temperature 98.1 F (36.7 C), temperature source Oral, resp. rate 18, height 5\' 8"  (1.727 m), weight 99.5 kg, last menstrual period 08/21/2022, SpO2 100%, unknown if currently breastfeeding.  Physical Exam:  General: alert, cooperative and no distress Chest: no respiratory distress Heart:regular rate, distal pulses intact Uterine Fundus: firm, appropriately tender DVT Evaluation: No calf swelling or tenderness Extremities: trace edema Skin: warm, dry  Recent Labs    05/10/23 2249 05/11/23 0654  HGB 10.7* 10.0*  HCT 34.4* 32.2*    Assessment/Plan: GLENNYS SCHORSCH is a 23 y.o. [id number] s/p SVD at [redacted]w[redacted]d   PPD#1 - Doing well  Routine postpartum care  Pt will try to have nursery staff assist for 1-2hr nap later  Contraception: Phexxi Feeding: breast Dispo: Plan for discharge 4/21.   LOS: 2 days   Ebony Goldstein, MD OB Fellow  05/12/2023, 6:01 AM
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
## 2023-05-12 NOTE — Lactation Note (Signed)
 This note was copied from a baby's chart. Lactation Consultation Note  Patient Name: Boy Mykeria Garman [id number] Date: 05/12/2023 Age:21 hours, P2  Reason for consult: Follow-up assessment Per mom mentioned she was having sore nipples.  LC offered to assess and mom receptive.  LC noted areola edema at the base of both nipples.  LC reviewed steps for latching, including reverse pressure exercise.  LC reviewed Breast feeding D/C teaching and the [facility] resources.    Maternal Data Has patient been taught Hand Expression?: Yes Does the patient have breastfeeding experience prior to this delivery?: Yes  Feeding Mother's Current Feeding Choice: Breast Milk   Lactation Tools Discussed/Used Tools: Shells;Pump;Flanges Flange Size: 18 Breast pump type: Manual;Double-Electric Breast Pump Pump Education: Setup, frequency, and cleaning;Milk Storage Reason for Pumping: PRN  Interventions Interventions: Breast feeding basics reviewed;Reverse pressure;Pre-pump if needed;Shells;Hand pump;DEBP;Education;LC Services brochure;CDC milk storage guidelines;CDC Guidelines for Breast Pump Cleaning  Discharge Discharge Education: Engorgement and breast care;Warning signs for feeding baby Pump: Hands Free;Manual;Personal  Consult Status Consult Status: Complete Date: 05/12/23    Richarda Chance 05/12/2023, 1:30 PM

## 2023-05-20 ENCOUNTER — Telehealth: Payer: MEDICAID

## 2023-05-22 ENCOUNTER — Telehealth (HOSPITAL_COMMUNITY): Payer: Self-pay | Admitting: *Deleted

## 2023-05-22 NOTE — Telephone Encounter (Signed)
 05/22/2023  Name: Amy Barrera MRN: [id number] DOB: 09-27-2000  Reason for Call:  Transition of Care Hospital Discharge Call  Contact Status: Patient Contact Status: Unable to contact ("the number you have dialed is no longer in service")  Language assistant needed:          Follow-Up Questions:    Dimple Francis Postnatal Depression Scale:  In the Past 7 Days:    PHQ2-9 Depression Scale:     Discharge Follow-up:    Post-discharge interventions: NA  Pearlie Bougie, RN 05/22/2023 10:18

## 2023-05-23 IMAGING — US US MFM OB FOLLOW-UP
1 series · 13 of 28 positions shown · non-contrast
Comparison: none

[Series 1: us mfm ob follow-up · 13 of 88 slices shown]
[im 4/88]
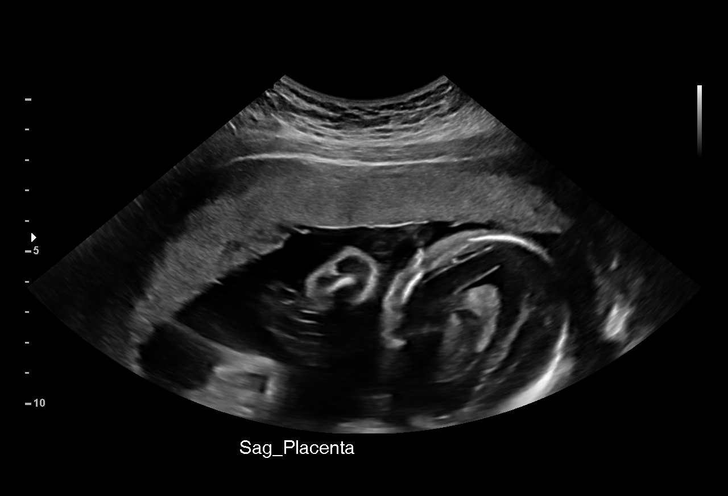
[im 10/88]
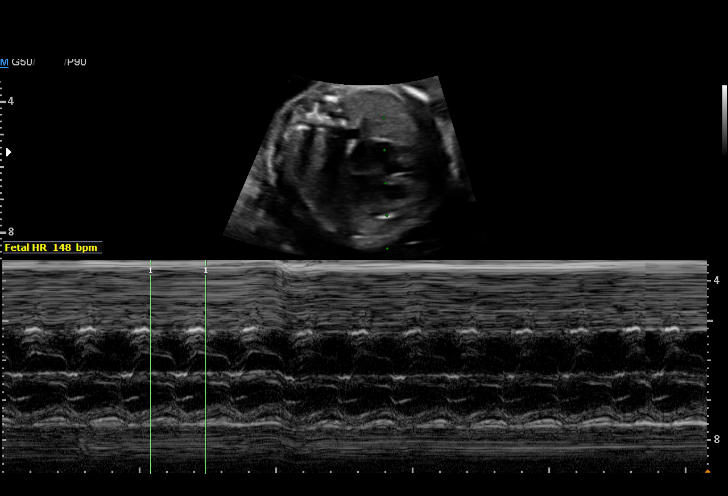
[im 17/88]
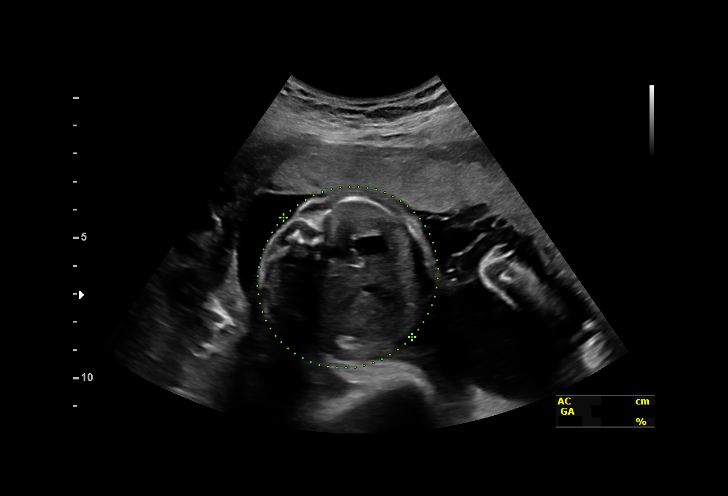
[im 23/88]
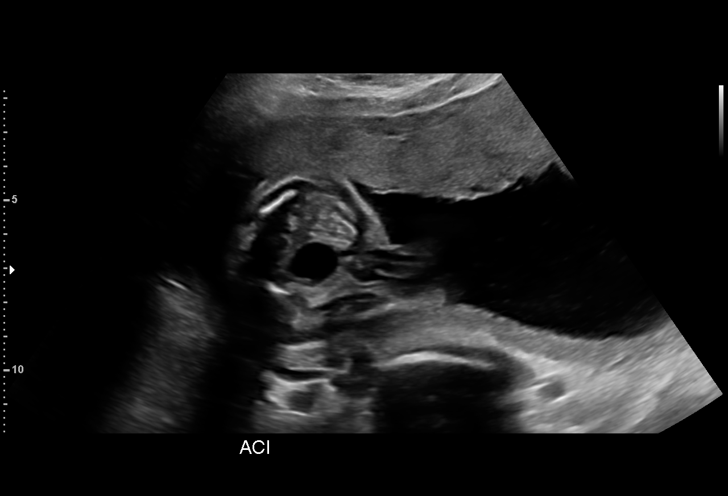
[im 30/88]
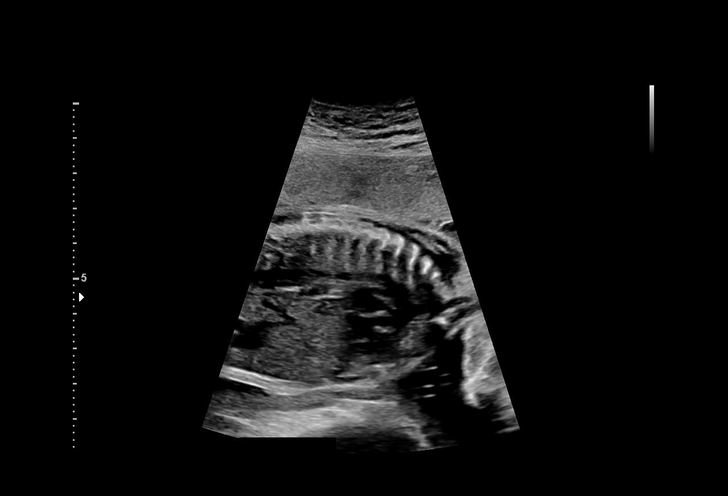
[im 36/88]
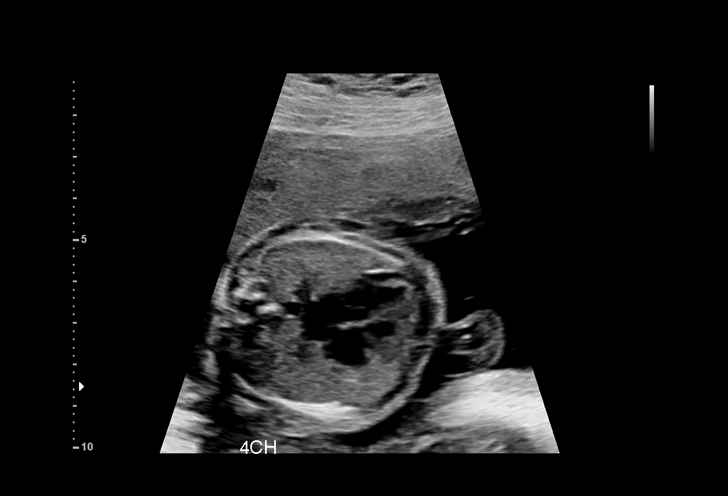
[im 46/88]
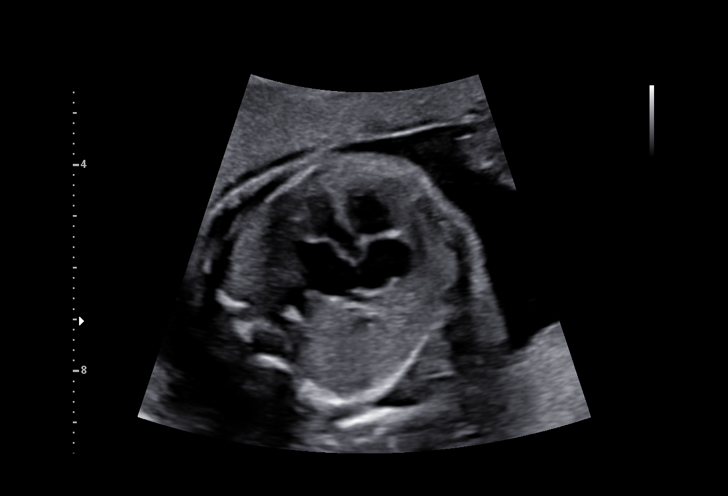
[im 52/88]
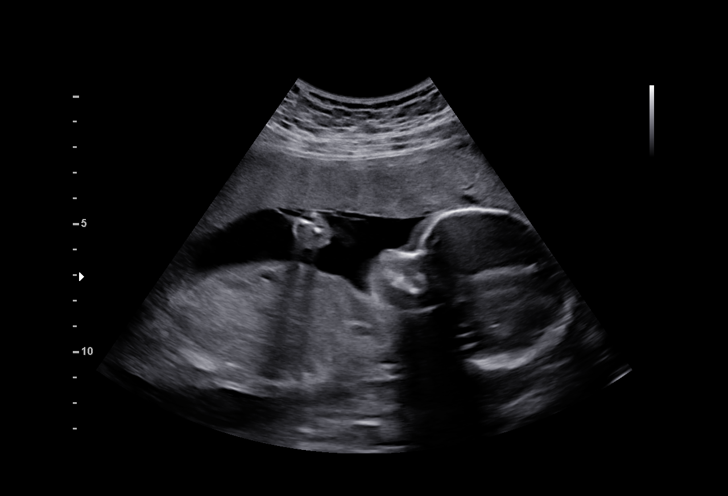
[im 59/88]
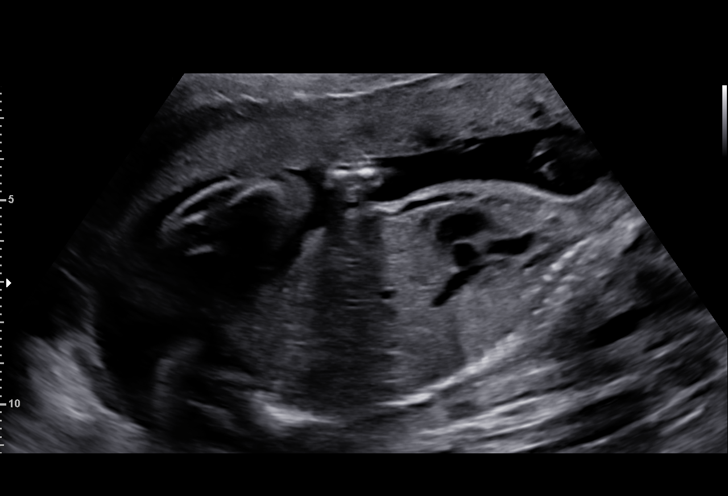
[im 65/88]
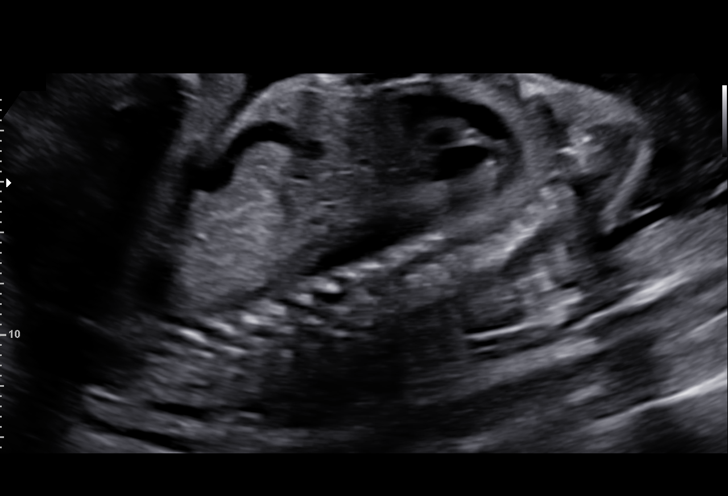
[im 71/88]
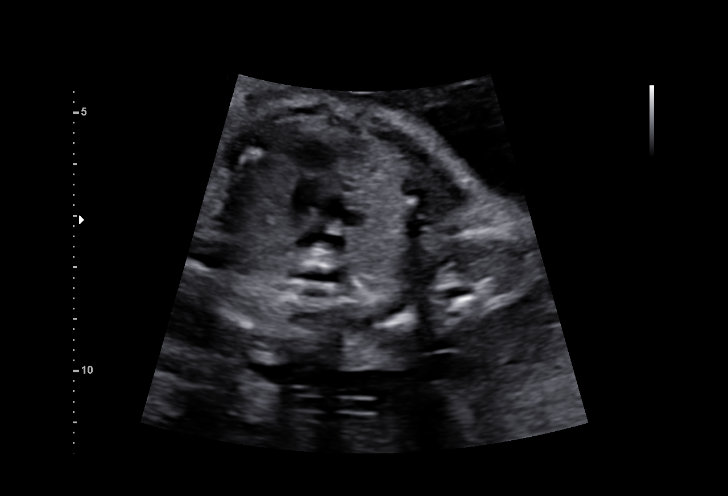
[im 78/88]
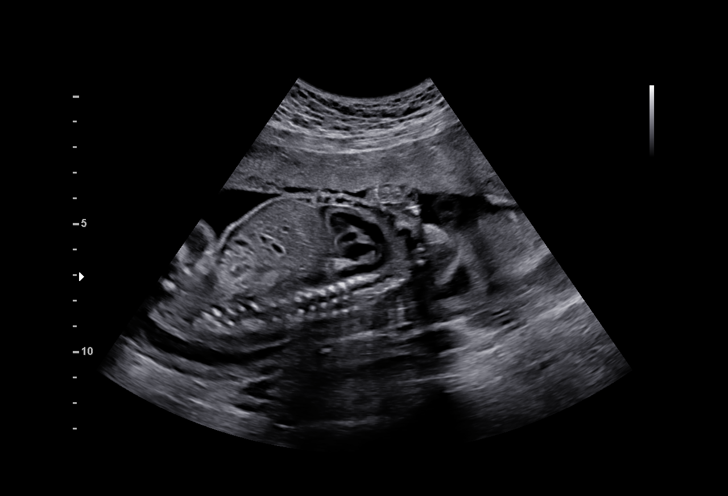
[im 84/88]
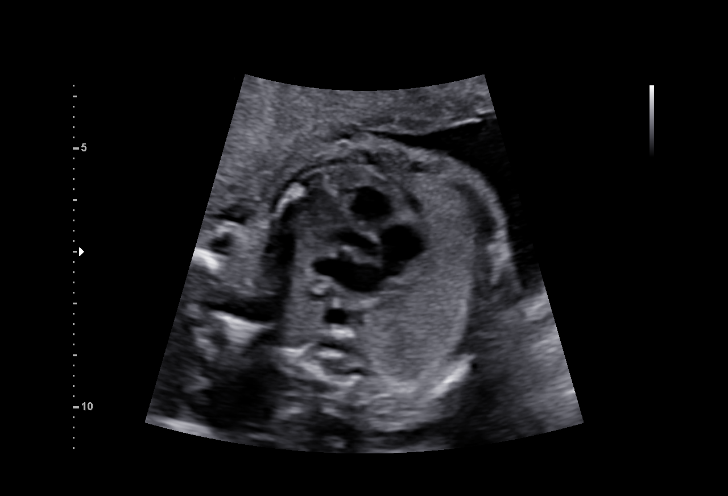

[13 of 28 positions shown; findings below may reference images not displayed]

Indications

 Bipolar disease during pregnancy
 Genetic carrier (Jorg Zellner silent carrier)
 22 weeks gestation of pregnancy
 Encounter for other antenatal screening
 follow-up
 Neg AFP/ LR NIPS
Fetal Evaluation

 Num Of Fetuses:         1
 Fetal Heart Rate(bpm):  148
 Cardiac Activity:       Observed
 Presentation:           Cephalic
 Placenta:               Anterior
 P. Cord Insertion:      Visualized

 Amniotic Fluid
 AFI FV:      Within normal limits

                             Largest Pocket(cm)

Biometry

 BPD:      55.6  mm     G. Age:  23w 0d         50  %    CI:        66.73   %    70 - 86
                                                         FL/HC:      17.8   %    19.2 -
 HC:      218.1  mm     G. Age:  23w 6d         75  %    HC/AC:      1.10        1.05 -
 AC:      199.1  mm     G. Age:  24w 4d         89  %    FL/BPD:     69.8   %    71 - 87
 FL:       38.8  mm     G. Age:  22w 3d         26  %    FL/AC:      19.5   %    20 - 24

 LV:        3.6  mm

 Est. FW:     614  gm      1 lb 6 oz     80  %
OB History

 Gravidity:    1
Gestational Age

 LMP:           29w 4d        Date:  09/02/20                 EDD:   06/09/21
 Clinical EDD:  22w 6d                                        EDD:   07/26/21
 U/S Today:     23w 3d                                        EDD:   07/22/21
 Best:          22w 6d     Det. By:  Early Ultrasound         EDD:   07/26/21
                                     (12/02/20)
Anatomy

 Cranium:               Appears normal         Aortic Arch:            Appears normal
 Cavum:                 Previously seen        Ductal Arch:            Appears normal
 Ventricles:            Appears normal         Diaphragm:              Appears normal
 Choroid Plexus:        Previously seen        Stomach:                Appears normal, left
                                                                       sided
 Cerebellum:            Previously seen        Abdomen:                Appears normal
 Posterior Fossa:       Previously seen        Abdominal Wall:         Previously seen
 Nuchal Fold:           Previously seen        Cord Vessels:           Previously seen
 Face:                  Orbits and profile     Kidneys:                Appear normal
                        previously seen
 Lips:                  Previously seen        Bladder:                Appears normal
 Thoracic:              Appears normal         Spine:                  Previously seen
 Heart:                 Appears normal         Upper Extremities:      Previously seen
                        (4CH, axis, and
                        situs)
 RVOT:                  Appears normal         Lower Extremities:      Previously seen
 LVOT:                  Appears normal

 Other:  Fetus appears to be female. Nasal bone visualized. Lenses
         visualized. previously
Cervix Uterus Adnexa

 Cervix
 Length:           3.45  cm.
 Normal appearance by transabdominal scan.
Comments

 This patient was seen for a follow up exam as the fetal
 cardiac views were unable to be fully visualized during her
 prior exam.  She denies any problems since her last exam.
 She was informed that the fetal growth and amniotic fluid
 level appears appropriate for her gestational age.
 The views of the fetal heart were visualized today.  There
 were no obvious fetal cardiac anomalies suspected.  The
 limitations of ultrasound in the detection of all anomalies was
 discussed.
 Follow-up as indicated.

## 2023-05-25 ENCOUNTER — Encounter (HOSPITAL_COMMUNITY): Payer: Self-pay | Admitting: Obstetrics & Gynecology

## 2023-05-25 ENCOUNTER — Inpatient Hospital Stay (HOSPITAL_COMMUNITY)
Admission: AD | Admit: 2023-05-25 | Discharge: 2023-05-25 | Disposition: A | Discharge status: Home / Self Care | Payer: MEDICAID | Attending: Obstetrics and Gynecology | Admitting: Obstetrics and Gynecology

## 2023-05-25 DIAGNOSIS — O9122 Nonpurulent mastitis associated with the puerperium: Secondary | ICD-10-CM | POA: Insufficient documentation

## 2023-05-25 DIAGNOSIS — N61 Mastitis without abscess: Secondary | ICD-10-CM | POA: Diagnosis not present

## 2023-05-25 MED ORDER — FLUCONAZOLE 150 MG PO TABS: 150.0000 mg | ORAL_TABLET | Freq: Every day | ORAL | 1 refills | Status: DC

## 2023-05-25 MED ORDER — CEFADROXIL 500 MG PO CAPS: 500.0000 mg | ORAL_CAPSULE | Freq: Two times a day (BID) | ORAL | 0 refills | Status: AC

## 2023-05-25 NOTE — MAU Provider Note (Signed)
 Chief Complaint: Breast Pain   None     SUBJECTIVE HPI: BELIEVE Amy Barrera is a 23 y.o. [phone] who is 2 weeks postpartum following vaignal delivery presents to maternity admissions reporting pain and redness in her left breast, flu-like symptoms, and a palpable lump in the upper mid portion of the left breast.  She is breastfeeding which is going well, but last night the baby slept longer and she did not pump at 3 am, his usual feeding time.  When he breastfeeds, he empties both breasts well, and they feel soft after feedings. She took ibuprofen  at home this morning and felt like the fever broke. She feels better now.  There are no  She denies vaginal bleeding, vaginal itching/burning, urinary symptoms, h/a, dizziness, n/v, or fever/chills.    HPI  Past Medical History:  Diagnosis Date   ADHD    Anxiety    Bipolar disorder (HCC)    Depression    History of gestational hypertension    Irritability and anger 11/30/2015   Self-mutilation    SVT (supraventricular tachycardia) (HCC)    Past Surgical History:  Procedure Laterality Date   NO PAST SURGERIES     Social History   Socioeconomic History   Marital status: Significant Other    Spouse name: Not on file   Number of children: Not on file   Years of education: Not on file   Highest education level: High school graduate  Occupational History   Occupation: unemployed  Tobacco Use   Smoking status: Former    Current packs/day: 0.50    Types: Cigarettes    Passive exposure: Never   Smokeless tobacco: Never  Vaping Use   Vaping status: Never Used  Substance and Sexual Activity   Alcohol use: Not Currently    Comment: occ   Drug use: Not Currently    Types: Marijuana, Oxycodone     Comment: "tramadol, lyrica, oxy from 2 dealers sometimes"   Sexual activity: Yes    Birth control/protection: None  Other Topics Concern   Not on file  Social History Narrative   Lives with grandmother       Social Drivers of Health    Financial Resource Strain: Low Risk  (12/05/2022)   Overall Financial Resource Strain (CARDIA)    Difficulty of Paying Living Expenses: Not very hard  Food Insecurity: No Food Insecurity (05/10/2023)   Hunger Vital Sign    Worried About Running Out of Food in the Last Year: Never true    Ran Out of Food in the Last Year: Never true  Transportation Needs: No Transportation Needs (05/10/2023)   PRAPARE - Administrator, Civil Service (Medical): No    Lack of Transportation (Non-Medical): No  Physical Activity: Insufficiently Active (12/05/2022)   Exercise Vital Sign    Days of Exercise per Week: 3 days    Minutes of Exercise per Session: 30 min  Stress: No Stress Concern Present (12/05/2022)   [employer] of Occupational Health - Occupational Stress Questionnaire    Feeling of Stress : Not at all  Social Connections: Unknown (05/10/2023)   Social Connection and Isolation Panel [NHANES]    Frequency of Communication with Friends and Family: Patient unable to answer    Frequency of Social Gatherings with Friends and Family: Patient unable to answer    Attends Religious Services: Patient unable to answer    Active Member of Clubs or Organizations: Patient unable to answer    Attends Banker Meetings:  Patient unable to answer    Marital Status: Living with partner  Intimate Partner Violence: Patient Unable To Answer (05/10/2023)   Humiliation, Afraid, Rape, and Kick questionnaire    Fear of Current or Ex-Partner: Patient unable to answer    Emotionally Abused: Patient unable to answer    Physically Abused: Patient unable to answer    Sexually Abused: Patient unable to answer   No current facility-administered medications on file prior to encounter.   Current Outpatient Medications on File Prior to Encounter  Medication Sig Dispense Refill   acetaminophen  (TYLENOL ) 325 MG tablet Take 2 tablets (650 mg total) by mouth every 4 (four) hours as needed (for pain  scale < 4). 30 tablet 0   cyclobenzaprine  (FLEXERIL ) 10 MG tablet Take 1 tablet (10 mg total) by mouth 3 (three) times daily as needed for muscle spasms. 30 tablet 0   furosemide  (LASIX ) 20 MG tablet Take 1 tablet (20 mg total) by mouth daily. 5 tablet 0   ibuprofen  (ADVIL ) 600 MG tablet Take 1 tablet (600 mg total) by mouth every 6 (six) hours. 30 tablet 0   omeprazole (PRILOSEC) 20 MG capsule Take 20 mg by mouth daily.     potassium chloride  SA (KLOR-CON  M) 20 MEQ tablet Take 1 tablet (20 mEq total) by mouth daily. 5 tablet 0   Prenatal Vit-Fe Fumarate-FA (PRENATAL VITAMIN PO) Take 1 tablet by mouth daily.     senna-docusate (SENOKOT-S) 8.6-50 MG tablet Take 2 tablets by mouth at bedtime as needed for mild constipation. 30 tablet 0   No Known Allergies  ROS:  Review of Systems  Constitutional:  Positive for chills and fever. Negative for fatigue.  HENT:  Negative for sinus pressure.   Eyes:  Negative for photophobia.  Respiratory:  Negative for shortness of breath.   Cardiovascular:  Negative for chest pain.  Gastrointestinal:  Negative for abdominal pain, nausea and vomiting.  Genitourinary:  Negative for difficulty urinating, dysuria, flank pain, frequency, pelvic pain, vaginal bleeding, vaginal discharge and vaginal pain.  Musculoskeletal:  Negative for neck pain.  Skin:        Erythema with red streaks on left breast  Neurological:  Positive for headaches. Negative for dizziness and weakness.  Psychiatric/Behavioral: Negative.       I have reviewed patient's Past Medical Hx, Surgical Hx, Family Hx, Social Hx, medications and allergies.   Physical Exam  Patient Vitals for the past 24 hrs:  BP Temp Temp src Pulse Resp SpO2 Height Weight  05/25/23 1517 111/79 98.6 F (37 C) Oral (!) 107 17 98 % -- --  05/25/23 1412 115/77 99.1 F (37.3 C) Oral (!) 115 18 99 % 5\' 8"  (1.727 m) 90.9 kg   Constitutional: Well-developed, well-nourished female in no acute distress.   Cardiovascular: normal rate Respiratory: normal effort GI: Abd soft, non-tender. Pos BS x 4 MS: Extremities nontender, no edema, normal ROM Neurologic: Alert and oriented x 4.  GU: Neg CVAT.  Breast exam with smooth firm palpable mass in mid upper part of left breast.  Some erythema noted above areola.  Cool to touch on exam. Right breast wnl.    LAB RESULTS No results found for this or any previous visit (from the past 24 hours).  --/--/O POS (04/18 9604)  IMAGING No results found.  MAU Management/MDM: Orders Placed This Encounter  Procedures   Discharge patient Discharge disposition: 01-Home or Self Care; Discharge patient date: 05/25/2023    Meds ordered this encounter  Medications  cefadroxil (DURICEF) 500 MG capsule    Sig: Take 1 capsule (500 mg total) by mouth 2 (two) times daily for 7 days.    Dispense:  14 capsule    Refill:  0   fluconazole  (DIFLUCAN ) 150 MG tablet    Sig: Take 1 tablet (150 mg total) by mouth daily.    Dispense:  1 tablet    Refill:  1    Likely mastitis of left breast.  Discussed with pt that current recommended management of mastitis is with ice and NSAIDs, and increased PO fluids.  ABX are not a first line treatment as inflammation, and not a bacterial source is the likely cause of symptoms.  Reviewed options with patient, and given that it is a weekend, and symptoms just started today, will send Rx for Duricef but pt to wait and treat conservatively for 24-48 hours. If symptoms improve, she does not need to take the course of ABX.  If symptoms persist or worsen, she is to begin the Duricef.  Pt has ice back for breast and to ice on and off as much as possible in next 24 hours, take NSAIDs on schedule, rest, and drink fluids.  F/U with lactation on Monday if symptoms continue, return to MAU for emergencies.    ASSESSMENT 1. Mastitis     PLAN Discharge home Allergies as of 05/25/2023   No Known Allergies      Medication List     TAKE  these medications    acetaminophen  325 MG tablet Commonly known as: Tylenol  Take 2 tablets (650 mg total) by mouth every 4 (four) hours as needed (for pain scale < 4).   cefadroxil 500 MG capsule Commonly known as: DURICEF Take 1 capsule (500 mg total) by mouth 2 (two) times daily for 7 days.   cyclobenzaprine  10 MG tablet Commonly known as: FLEXERIL  Take 1 tablet (10 mg total) by mouth 3 (three) times daily as needed for muscle spasms.   fluconazole  150 MG tablet Commonly known as: Diflucan  Take 1 tablet (150 mg total) by mouth daily.   furosemide  20 MG tablet Commonly known as: Lasix  Take 1 tablet (20 mg total) by mouth daily.   ibuprofen  600 MG tablet Commonly known as: ADVIL  Take 1 tablet (600 mg total) by mouth every 6 (six) hours.   omeprazole 20 MG capsule Commonly known as: PRILOSEC Take 20 mg by mouth daily.   potassium chloride  SA 20 MEQ tablet Commonly known as: KLOR-CON  M Take 1 tablet (20 mEq total) by mouth daily.   PRENATAL VITAMIN PO Take 1 tablet by mouth daily.   senna-docusate 8.6-50 MG tablet Commonly known as: Senokot-S Take 2 tablets by mouth at bedtime as needed for mild constipation.        Follow-up Information     [facility] Follow up.   Specialty: Obstetrics and Gynecology Contact information: [address] [place]  [address] [phone]        [facility] .   Specialty: Obstetrics and Gynecology Contact information: [address] [place]   [address] [phone]                Arlester Bence Certified Nurse-Midwife 05/25/2023  7:29 PM

## 2023-05-25 NOTE — MAU Note (Signed)
 MAU Triage Note  .Amy Barrera is a 23 y.o. at [redacted]w[redacted]d here in MAU reporting: breast pain that has a lump on left breast.  It is warm to touch.  Pt reports fever 101.4 oral, she currently has a headache and is sensitive to light.  Pt states that she has taken ibuprofen  600 mg for pain, was not effective.   Pt reports that she is 2 1/2 weeks pp.    Onset of complaint: 0500 Pain score: 8 Vitals:   05/25/23 1412  BP: 115/77  Pulse: (!) 115  Resp: 18  SpO2: 99%      Lab orders placed from triage: none

## 2023-06-05 ENCOUNTER — Encounter: Payer: Self-pay | Admitting: Women's Health

## 2023-06-06 ENCOUNTER — Telehealth: Payer: MEDICAID | Admitting: Women's Health

## 2023-06-06 ENCOUNTER — Encounter: Payer: Self-pay | Admitting: Women's Health

## 2023-06-06 VITALS — BP 130/81 | HR 120 | Ht 68.0 in | Wt 199.0 lb

## 2023-06-06 DIAGNOSIS — F418 Other specified anxiety disorders: Secondary | ICD-10-CM | POA: Diagnosis not present

## 2023-06-06 DIAGNOSIS — F53 Postpartum depression: Secondary | ICD-10-CM | POA: Diagnosis not present

## 2023-06-06 DIAGNOSIS — O99345 Other mental disorders complicating the puerperium: Secondary | ICD-10-CM | POA: Diagnosis not present

## 2023-06-06 MED ORDER — SERTRALINE HCL 25 MG PO TABS: 25.0000 mg | ORAL_TABLET | Freq: Every day | ORAL | 6 refills | Status: DC

## 2023-06-06 NOTE — Progress Notes (Signed)
 TELEHEALTH VIRTUAL GYN VISIT ENCOUNTER NOTE Patient name: Amy Barrera MRN [id number]  Date of birth: 2000/12/11  I connected with patient on 06/06/23 at 11:30 AM EDT by MyChart video  and verified that I am speaking with the correct person using two identifiers.  Pt is not currently in the office, she is at home.  Provider is in the office.    I discussed the limitations, risks, security and privacy concerns of performing an evaluation and management service by telephone and the availability of in person appointments. I also discussed with the patient that there may be a patient responsible charge related to this service. The patient expressed understanding and agreed to proceed.   Chief Complaint:   Postpartum Care (PPD)  History of Present Illness:   Amy Barrera is a 23 y.o. [id number] Caucasian female 3wks s/p SVB being evaluated today for PPD/anxiety. H/O dep/anx, not on any meds right now, has been on many meds in past (avoid prozac -didn't do well w/ it in past). Did well w/ zoloft  25mg . Not sleeping well d/t mind not being able to shut off. Still finds joy in things she used to. Appetite is good. Good support from partner. Denies SI/HI/II. EPDS in hospital 3, today 16. Breastfeeding.     03/05/2023    9:03 AM 12/05/2022    9:46 AM 04/27/2021    9:37 AM 01/12/2021    3:02 PM  Depression screen PHQ 2/9  Decreased Interest 0 0 0 0  Down, Depressed, Hopeless 0 0 0 0  PHQ - 2 Score 0 0 0 0  Altered sleeping 2 0 0 0  Tired, decreased energy 1 0 0 0  Change in appetite 0 0 0 0  Feeling bad or failure about yourself  2 0 0 0  Trouble concentrating  0 0 0  Moving slowly or fidgety/restless 0 0 0 0  Suicidal thoughts 0 0 0 0  PHQ-9 Score 5 0 0 0  Difficult doing work/chores    Not difficult at all    Patient's last menstrual period was 08/21/2022. Review of Systems:   Pertinent items are noted in HPI Denies fever/chills, dizziness, headaches, visual disturbances, fatigue,  shortness of breath, chest pain, abdominal pain, vomiting, abnormal vaginal discharge/itching/odor/irritation, problems with periods, bowel movements, urination, or intercourse unless otherwise stated above.  Pertinent History Reviewed:  Reviewed past medical,surgical, social, obstetrical and family history.  Reviewed problem list, medications and allergies. Physical Assessment:   Vitals:   06/06/23 1123  BP: 130/81  Pulse: (!) 120  Weight: 199 lb (90.3 kg)  Height: 5\' 8"  (1.727 m)  Body mass index is 30.26 kg/m.       Physical Examination:   General:  Alert, oriented and cooperative.   Mental Status: Normal mood and affect perceived. Normal judgment and thought content.  Physical exam deferred due to nature of the encounter  No results found for this or any previous visit (from the past 24 hours).  Assessment & Plan:  1) 3wk s/p SVB> breastfeeding  2) PP dep/anx> rx zoloft  25mg , understands can take few weeks to notice improvement. [facility] referral entered. F/U 6/2 for pp visit as scheduled  Meds:  Meds ordered this encounter  Medications   sertraline  (ZOLOFT ) 25 MG tablet    Sig: Take 1 tablet (25 mg total) by mouth daily.    Dispense:  30 tablet    Refill:  6    Orders Placed This Encounter  Procedures   Amb ref  to [facility]    I discussed the assessment and treatment plan with the patient. The patient was provided an opportunity to ask questions and all were answered. The patient agreed with the plan and demonstrated an understanding of the instructions.   The patient was advised to call back or seek an in-person evaluation/go to the ED if the symptoms worsen or if the condition fails to improve as anticipated.  I provided 15 minutes of non-face-to-face time during this encounter.   Return for As scheduled.  Ferd Householder CNM, [facility] 06/06/2023 12:04 PM

## 2023-06-24 ENCOUNTER — Other Ambulatory Visit (HOSPITAL_COMMUNITY)
Admission: RE | Admit: 2023-06-24 | Discharge: 2023-06-24 | Disposition: A | Discharge status: Home / Self Care | Payer: MEDICAID | Source: Ambulatory Visit | Attending: Women's Health | Admitting: Women's Health

## 2023-06-24 ENCOUNTER — Ambulatory Visit: Payer: MEDICAID | Admitting: Women's Health

## 2023-06-24 ENCOUNTER — Encounter: Payer: Self-pay | Admitting: Women's Health

## 2023-06-24 DIAGNOSIS — Z3202 Encounter for pregnancy test, result negative: Secondary | ICD-10-CM | POA: Diagnosis not present

## 2023-06-24 DIAGNOSIS — Z124 Encounter for screening for malignant neoplasm of cervix: Secondary | ICD-10-CM | POA: Insufficient documentation

## 2023-06-24 DIAGNOSIS — Z30430 Encounter for insertion of intrauterine contraceptive device: Secondary | ICD-10-CM

## 2023-06-24 DIAGNOSIS — F418 Other specified anxiety disorders: Secondary | ICD-10-CM

## 2023-06-24 LAB — POCT URINE PREGNANCY: Preg Test, Ur: NEGATIVE

## 2023-06-24 MED ORDER — PARAGARD INTRAUTERINE COPPER IU IUD
1.0000 | INTRAUTERINE_SYSTEM | Freq: Once | INTRAUTERINE | Status: AC
  Administered 2023-06-24: 1 via INTRAUTERINE

## 2023-06-24 NOTE — Patient Instructions (Signed)
 Nothing in vagina for 3 days (no sex, douching, tampons, etc...) Check your strings once a month to make sure you can feel them, if you are not able to please let us know If you develop a fever of 100.4 or more in the next few weeks, or if you develop severe abdominal pain, please let [place] know Use a backup method of birth control, such as condoms, for 2 weeks   Copper Intrauterine Device (IUD) What is this medication? COPPER IUD (KOP er EYE YOU DEE) prevents pregnancy. It works by using copper to prevent sperm from reaching the egg (fertilization) without hormones. It is a reversible, long-term contraceptive. This medicine may be used for other purposes; ask your health care provider or pharmacist if you have questions. COMMON BRAND NAME(S): ParaGard T380A What should I tell my care team before I take this medication? They need to know if you have any of these conditions: Abnormal Pap smear Cancer, including leukemia, uterine cancer, cervical cancer Condition of the uterus that changes its shape, such as large fibroid tumors Endometriosis Genital or pelvic infection now or in the past 3 months Have more than one sexual partner or your partner has more than one partner History of an ectopic or tubal pregnancy Immune system problems Intrauterine system already in your uterus Pelvic inflammatory disease (PID) Sexually transmitted infection (STI) Substance abuse disorder Unexplained vaginal bleeding An unusual or allergic reaction to copper, barium sulfate, polyethylene, other medications, foods, dyes, or preservatives Pregnant or trying to get pregnant Breast-feeding How should I use this medication? This device is placed inside the uterus by your care team. A patient package insert for the product will be given each time it is inserted. Be sure to read this information carefully each time. The sheet may change often. Talk to your care team about use of this medication in children. Special  care may be needed. Overdosage: If you think you have taken too much of this medicine contact a poison control center or emergency room at once. NOTE: This medicine is only for you. Do not share this medicine with others. What if I miss a dose? This does not apply. The device will need to be replaced every 10 years if you wish to continue using this type of contraception. What may interact with this medication? Interactions are not expected. This list may not describe all possible interactions. Give your health care provider a list of all the medicines, herbs, non-prescription drugs, or dietary supplements you use. Also tell them if you smoke, drink alcohol, or use illegal drugs. Some items may interact with your medicine. What should I watch for while using this medication? Visit your care team for regular check-ups. Talk to your care team if you wish to become pregnant or think you might be pregnant. The IUD will need to be removed. The IUD does not protect you or your partner against HIV or any other sexually transmitted infections. Tell your care team if you or your partner becomes HIV positive or gets a sexually transmitted infection. You can check the placement of the IUD yourself by reaching up to the top of your vagina with clean fingers to feel the threads. Do not pull on the threads. It is a good habit to check placement after each menstrual period. Call your care team right away if you feel more of the IUD than just the threads or if you cannot feel the threads at all. The IUD may come out by itself. You may  become pregnant if the device comes out. If you notice that the IUD has come out use a backup contraceptive method, such as condoms, and call your care team. Using tampons will not change the position of the IUD. It is okay to use tampons during your menstrual period. This IUD can be safely scanned with magnetic resonance imaging (MRI) only under specific conditions. Before you have an  MRI, tell your care team that you have an IUD in place, and which type of IUD you have in place. What side effects may I notice from receiving this medication? Side effects that you should report to your care team as soon as possible: Allergic reactions--skin rash, itching, hives, swelling of the face, lips, tongue, or throat Heavy vaginal bleeding Low red blood cell level--unusual weakness or fatigue, dizziness, headache, trouble breathing Pelvic inflammatory disease (PID)--fever, abdominal pain, pelvic pain, pain or trouble passing urine, spotting, bleeding during or after sex Seizures Slow heartbeat--dizziness, feeling faint or lightheaded, confusion, trouble breathing, unusual weakness or fatigue Stomach pain, unusual weakness or fatigue, nausea, vomiting, diarrhea, or fever that lasts longer than expected Unusual vaginal discharge, itching, or odor Vaginal pain, irritation, or sores Side effects that usually do not require medical attention (report these to your care team if they continue or are bothersome): Back pain Irregular menstrual cycles or spotting Menstrual cramps Vaginal discharge This list may not describe all possible side effects. Call your doctor for medical advice about side effects. You may report side effects to FDA at 1-800-FDA-1088. Where should I keep my medication? This does not apply. NOTE: This sheet is a summary. It may not cover all possible information. If you have questions about this medicine, talk to your doctor, pharmacist, or health care provider.  2024 Elsevier/Gold Standard (2021-03-31 00:00:00)

## 2023-06-24 NOTE — Addendum Note (Signed)
 Addended by: Myrl Askew on: 06/24/2023 02:42 PM   Modules accepted: Orders

## 2023-06-24 NOTE — Addendum Note (Signed)
 Addended by: Myrl Askew on: 06/24/2023 11:03 AM   Modules accepted: Orders

## 2023-06-24 NOTE — Progress Notes (Signed)
 POSTPARTUM VISIT Patient name: Amy Barrera MRN [id number]  Date of birth: [birth year] Chief Complaint:   Postpartum Care  History of Present Illness:   Amy Barrera is a 23 y.o. G92P2002 Caucasian female being seen today for a postpartum visit. She is 6 weeks postpartum following a spontaneous vaginal delivery at 37.4 gestational weeks. IOL: yes, for gestational hypertension . Anesthesia: epidural.  Laceration: none.  Complications: none. Inpatient contraception: no.   Pregnancy uncomplicated. Tobacco use: no. Substance use disorder: no. Last pap smear: never and results were N/A. Next pap smear due: now Patient's last menstrual period was 08/22/2022.  Postpartum course has been uncomplicated. Bleeding on period, started yesterday. Bowel function is normal. Bladder function is normal. Urinary incontinence? no, fecal incontinence? no Patient is sexually active. Last sexual activity: about a week ago. Desired contraception: IUD. Patient does not want a pregnancy in the future.  Desired family size is 2 children.   Upstream - 06/24/23 1035       Pregnancy Intention Screening   Does the patient want to become pregnant in the next year? No    Does the patient's partner want to become pregnant in the next year? No    Would the patient like to discuss contraceptive options today? Yes      Contraception Wrap Up   Current Method Female Condom    End Method IUD or IUS    Contraception Counseling Provided Yes            The pregnancy intention screening data noted above was reviewed. Potential methods of contraception were discussed. The patient elected to proceed with IUD or IUS.  Edinburgh Postpartum Depression Screening: positive, rx'd zoloft  25mg  5/15, hasn't started, stressful situation at home, but getting ready to move and hopes things will be better. Denies SI/HI/II.   Edinburgh Postnatal Depression Scale - 06/24/23 1034       Edinburgh Postnatal Depression Scale:  In the Past  7 Days   I have been able to laugh and see the funny side of things. 0    I have looked forward with enjoyment to things. 2    I have blamed myself unnecessarily when things went wrong. 3    I have been anxious or worried for no good reason. 2    I have felt scared or panicky for no good reason. 2    Things have been getting on top of me. 2    I have been so unhappy that I have had difficulty sleeping. 1    I have felt sad or miserable. 2    I have been so unhappy that I have been crying. 2    The thought of harming myself has occurred to me. 0    Edinburgh Postnatal Depression Scale Total 16                12/05/2022    9:46 AM 04/27/2021    9:37 AM 01/12/2021    3:02 PM  GAD 7 : Generalized Anxiety Score  Nervous, Anxious, on Edge 0 0 0  Control/stop worrying 0 0 0  Worry too much - different things 0 0 0  Trouble relaxing 0 0 0  Restless 0 0 0  Easily annoyed or irritable 0 0 0  Afraid - awful might happen 0 0 0  Total GAD 7 Score 0 0 0  Anxiety Difficulty   Not difficult at all     Baby's course has been uncomplicated. Baby is  feeding by breast: milk supply adequate. Infant has a pediatrician/family doctor? Yes.  Childcare strategy if returning to work/school: n/a-stay at home mom.  Review of Systems:   Pertinent items are noted in HPI Denies Abnormal vaginal discharge w/ itching/odor/irritation, headaches, visual changes, shortness of breath, chest pain, abdominal pain, severe nausea/vomiting, or problems with urination or bowel movements. Pertinent History Reviewed:  Reviewed past medical,surgical, obstetrical and family history.  Reviewed problem list, medications and allergies. OB History  Gravida Para Term Preterm AB Living  2 2 2  0 0 2  SAB IAB Ectopic Multiple Live Births  0 0 0 0 2    # Outcome Date GA Lbr Len/2nd Weight Sex Type Anes PTL Lv  2 Term 05/11/23 [redacted]w[redacted]d 05:10 / 00:08 8 lb 0.8 oz (3.65 kg) M Vag-Spont EPI  LIV     Birth Comments: WNL  1 Term  07/27/21 [redacted]w[redacted]d 04:57 / 01:30 7 lb 13 oz (3.544 kg) F Vag-Spont EPI N LIV     Complications: Gestational hypertension, Postpartum hemorrhage   Physical Assessment:   Vitals:   06/24/23 1033  BP: 121/74  Pulse: (!) 105  Weight: 200 lb (90.7 kg)  Height: 5\' 8"  (1.727 m)  Body mass index is 30.41 kg/m.       Physical Examination:   General appearance: alert, well appearing, and in no distress  Mental status: alert, oriented to person, place, and time  Skin: warm & dry   Cardiovascular: normal heart rate noted   Respiratory: normal respiratory effort, no distress   Breasts: deferred, no complaints   Abdomen: soft, non-tender   Pelvic: VULVA: normal appearing vulva with no masses, tenderness or lesions, VAGINA: normal appearing vagina with normal color and discharge, no lesions, CERVIX: normal appearing cervix without discharge or lesions. Thin prep pap obtained: Yes  Rectal: not examined  Extremities: Edema: none        No results found for this or any previous visit (from the past 24 hours).   IUD INSERTION The risks and benefits of the method and placement have been thouroughly reviewed with the patient and all questions were answered.  Specifically the patient is aware of failure rate of 01/998, expulsion of the IUD and of possible perforation.  The patient is aware of irregular bleeding due to the method and understands the incidence of irregular bleeding diminishes with time.  Signed copy of informed consent in chart.   Time out was performed.  A graves speculum was placed in the vagina.  The cervix was visualized, prepped using Betadine , and grasped with a single tooth tenaculum. The uterus was found to be retroflexed and it sounded to 8 cm.  Paragard  IUD placed per manufacturer's recommendations. The strings were trimmed to approximately 3 cm. The patient tolerated the procedure well.   Informal transvaginal sonogram was performed and the proper placement of the IUD was  verified.  Chaperone: Scientific laboratory technician & Plan:  1) Postpartum exam 2) 6 wks s/p spontaneous vaginal delivery after IOL for GHTN 3) breast feeding 4) Depression screening 5) Paragard IUD insertion The patient was given post procedure instructions, including signs and symptoms of infection and to check for the strings after each menses or each month, and refraining from intercourse or anything in the vagina for 3 days.  Condoms for 2 weeks. She was given a care card with date IUD placed, and date IUD to be removed.  She is scheduled for a f/u appointment in 4 weeks. 6) Dep/anx>  rx'd zoloft  25mg  and [facility] referral in May, hasn't started, hoping things will improve w/ upcoming move 7) Cervical cancer screen> pap today  Essential components of care per ACOG recommendations:  1.  Mood and well being:  If positive depression screen, discussed and plan developed.  If using tobacco we discussed reduction/cessation and risk of relapse If current substance abuse, we discussed and referral to local resources was offered.   2. Infant care and feeding:  If breastfeeding, discussed returning to work, pumping, breastfeeding-associated pain, guidance regarding return to fertility while lactating if not using another method. If needed, patient was provided with a letter to be allowed to pump q 2-3hrs to support lactation in a private location with access to a refrigerator to store breastmilk.   Recommended that all caregivers be immunized for flu, pertussis and other preventable communicable diseases If pt does not have material needs met for her/baby, referred to local resources for help obtaining these.  3. Sexuality, contraception and birth spacing Provided guidance regarding sexuality, management of dyspareunia, and resumption of intercourse Discussed avoiding interpregnancy interval <10mths and recommended birth spacing of 18 months  4. Sleep and fatigue Discussed coping options for  fatigue and sleep disruption Encouraged family/partner/community support of 4 hrs of uninterrupted sleep to help with mood and fatigue  5. Physical recovery  If pt had a C/S, assessed incisional pain and providing guidance on normal vs prolonged recovery If pt had a laceration, perineal healing and pain reviewed.  If urinary or fecal incontinence, discussed management and referred to PT or uro/gyn if indicated  Patient is safe to resume physical activity. Discussed attainment of healthy weight.  6.  Chronic disease management Discussed pregnancy complications if any, and their implications for future childbearing and long-term maternal health. Review recommendations for prevention of recurrent pregnancy complications, such as 17 hydroxyprogesterone caproate to reduce risk for recurrent PTB not applicable, or aspirin  to reduce risk of preeclampsia: did not discuss. Pt had GDM: No. If yes, 2hr GTT scheduled: no. Reviewed medications and non-pregnant dosing including consideration of whether pt is breastfeeding using a reliable resource such as LactMed: yes Referred for f/u w/ PCP or subspecialist providers as indicated: not applicable  7. Health maintenance Mammogram at 21yo or earlier if indicated Pap smears as indicated  Meds: No orders of the defined types were placed in this encounter.   Follow-up: Return in about 4 weeks (around 07/22/2023) for IUD f/u, CNM, in person.   No orders of the defined types were placed in this encounter.   Ferd Householder CNM, [facility] 06/24/2023 11:00 AM

## 2023-06-24 NOTE — Addendum Note (Signed)
 Addended by: Myrl Askew on: 06/24/2023 11:07 AM   Modules accepted: Orders

## 2023-06-27 LAB — CYTOLOGY - PAP
Diagnosis: NEGATIVE
Diagnosis: REACTIVE

## 2023-07-01 ENCOUNTER — Ambulatory Visit: Payer: Self-pay | Admitting: Women's Health

## 2023-07-22 ENCOUNTER — Encounter: Payer: Self-pay | Admitting: Women's Health

## 2023-07-22 ENCOUNTER — Ambulatory Visit: Payer: MEDICAID | Admitting: Women's Health

## 2023-07-22 VITALS — BP 117/82 | Ht 68.0 in | Wt 202.0 lb

## 2023-07-22 DIAGNOSIS — L709 Acne, unspecified: Secondary | ICD-10-CM | POA: Diagnosis not present

## 2023-07-22 DIAGNOSIS — Z30431 Encounter for routine checking of intrauterine contraceptive device: Secondary | ICD-10-CM | POA: Diagnosis not present

## 2023-07-22 DIAGNOSIS — F418 Other specified anxiety disorders: Secondary | ICD-10-CM

## 2023-07-22 MED ORDER — CLINDAMYCIN PHOS (TWICE-DAILY) 1 % EX GEL: Freq: Two times a day (BID) | CUTANEOUS | 6 refills | Status: AC | PRN

## 2023-07-22 NOTE — Progress Notes (Signed)
 GYN VISIT Patient name: Amy Barrera MRN [id number]  Date of birth: Dec 14, 2000 Chief Complaint:   IUD Check  History of Present Illness:   Amy Barrera is a 23 y.o. G54P2002 Caucasian female being seen today for IUD check. Paragard  inserted 06/24/23, bled til 6/24. No issues. Started zoloft  about a month ago, doing much better! EPDS 16 (06/24/23), today is 8! Needs refill on clindagel for acne, works very well.  Patient's last menstrual period was 06/24/2023. The current method of family planning is IUD.  Last pap 06/24/23. Results were: NILM w/ HRHPV not done     07/22/2023    4:15 PM 03/05/2023    9:03 AM 12/05/2022    9:46 AM 04/27/2021    9:37 AM 01/12/2021    3:02 PM  Depression screen PHQ 2/9  Decreased Interest 0 0 0 0 0  Down, Depressed, Hopeless 0 0 0 0 0  PHQ - 2 Score 0 0 0 0 0  Altered sleeping 2 2 0 0 0  Tired, decreased energy 1 1 0 0 0  Change in appetite 0 0 0 0 0  Feeling bad or failure about yourself  0 2 0 0 0  Trouble concentrating 1  0 0 0  Moving slowly or fidgety/restless 0 0 0 0 0  Suicidal thoughts 0 0 0 0 0  PHQ-9 Score 4 5 0 0 0  Difficult doing work/chores     Not difficult at all        07/22/2023    4:17 PM 12/05/2022    9:46 AM 04/27/2021    9:37 AM 01/12/2021    3:02 PM  GAD 7 : Generalized Anxiety Score  Nervous, Anxious, on Edge 1 0 0 0  Control/stop worrying 1 0 0 0  Worry too much - different things 0 0 0 0  Trouble relaxing 2 0 0 0  Restless 0 0 0 0  Easily annoyed or irritable 2 0 0 0  Afraid - awful might happen 0 0 0 0  Total GAD 7 Score 6 0 0 0  Anxiety Difficulty    Not difficult at all     Review of Systems:   Pertinent items are noted in HPI Denies fever/chills, dizziness, headaches, visual disturbances, fatigue, shortness of breath, chest pain, abdominal pain, vomiting, abnormal vaginal discharge/itching/odor/irritation, problems with periods, bowel movements, urination, or intercourse unless otherwise stated above.   Pertinent History Reviewed:  Reviewed past medical,surgical, social, obstetrical and family history.  Reviewed problem list, medications and allergies. Physical Assessment:   Vitals:   07/22/23 1545  BP: 117/82  Weight: 202 lb (91.6 kg)  Height: 5' 8 (1.727 m)  Body mass index is 30.71 kg/m.       Physical Examination:   General appearance: alert, well appearing, and in no distress  Mental status: alert, oriented to person, place, and time  Skin: warm & dry   Cardiovascular: normal heart rate noted  Respiratory: normal respiratory effort, no distress  Abdomen: soft, non-tender   Pelvic: VULVA: normal appearing vulva with no masses, tenderness or lesions, VAGINA: normal appearing vagina with normal color and discharge, no lesions, CERVIX: normal appearing cervix without discharge or lesions, IUD strings visible and appropriate length  Extremities: no edema   Chaperone: Peggy Dones  No results found for this or any previous visit (from the past 24 hours).  Assessment & Plan:  1) IUD check> in place, doing well  2) Acne> clindagel refilled per request  3) Dep/anx> doing well on zoloft , no concerns  Meds:  Meds ordered this encounter  Medications   clindamycin  (CLINDAGEL) 1 % gel    Sig: Apply topically 2 (two) times daily as needed (acne).    Dispense:  30 g    Refill:  6    No orders of the defined types were placed in this encounter.   Return in about 1 year (around 07/21/2024) for Physical.  Suzen JONELLE Fetters CNM, [facility] 07/22/2023 4:31 PM

## 2023-09-02 ENCOUNTER — Encounter: Payer: Self-pay | Admitting: Women's Health

## 2023-09-02 ENCOUNTER — Other Ambulatory Visit: Payer: Self-pay | Admitting: Women's Health

## 2023-09-02 MED ORDER — SERTRALINE HCL 50 MG PO TABS: 50.0000 mg | ORAL_TABLET | Freq: Every day | ORAL | 3 refills | Status: DC

## 2023-12-26 ENCOUNTER — Other Ambulatory Visit: Payer: Self-pay | Admitting: Women's Health

## 2024-01-10 ENCOUNTER — Other Ambulatory Visit: Payer: Self-pay | Admitting: Women's Health

## 2024-01-13 ENCOUNTER — Encounter: Payer: Self-pay | Admitting: Women's Health
